# Patient Record
Sex: Female | Born: 1970 | Race: Black or African American | Hispanic: No | Marital: Single | Smoking: Never smoker
Health system: Southern US, Community
[De-identification: ages and names within clinical notes are randomized; demographics above are authoritative.]

## PROBLEM LIST (undated history)

## (undated) DIAGNOSIS — E0500 Thyrotoxicosis with diffuse goiter without thyrotoxic crisis or storm: Secondary | ICD-10-CM

## (undated) HISTORY — PX: MYOMECTOMY: SHX85

## (undated) HISTORY — PX: OTHER SURGICAL HISTORY: SHX169

---

## 1999-07-10 ENCOUNTER — Other Ambulatory Visit: Admission: RE | Admit: 1999-07-10 | Discharge: 1999-07-10 | Payer: Self-pay | Admitting: Obstetrics and Gynecology

## 2000-06-29 ENCOUNTER — Other Ambulatory Visit: Admission: RE | Admit: 2000-06-29 | Discharge: 2000-06-29 | Payer: Self-pay | Admitting: Obstetrics and Gynecology

## 2001-11-04 ENCOUNTER — Other Ambulatory Visit: Admission: RE | Admit: 2001-11-04 | Discharge: 2001-11-04 | Payer: Self-pay | Admitting: Obstetrics and Gynecology

## 2003-01-10 ENCOUNTER — Emergency Department (HOSPITAL_COMMUNITY): Admission: EM | Admit: 2003-01-10 | Discharge: 2003-01-10 | Payer: Self-pay | Admitting: Emergency Medicine

## 2003-01-10 ENCOUNTER — Encounter: Payer: Self-pay | Admitting: Emergency Medicine

## 2003-01-30 ENCOUNTER — Other Ambulatory Visit: Admission: RE | Admit: 2003-01-30 | Discharge: 2003-01-30 | Payer: Self-pay | Admitting: Obstetrics and Gynecology

## 2003-02-08 ENCOUNTER — Encounter: Payer: Self-pay | Admitting: Obstetrics and Gynecology

## 2003-02-08 ENCOUNTER — Ambulatory Visit (HOSPITAL_COMMUNITY): Admission: RE | Admit: 2003-02-08 | Discharge: 2003-02-08 | Payer: Self-pay | Admitting: Obstetrics and Gynecology

## 2003-02-09 ENCOUNTER — Encounter (INDEPENDENT_AMBULATORY_CARE_PROVIDER_SITE_OTHER): Payer: Self-pay

## 2003-02-09 ENCOUNTER — Ambulatory Visit (HOSPITAL_COMMUNITY): Admission: RE | Admit: 2003-02-09 | Discharge: 2003-02-09 | Payer: Self-pay | Admitting: Obstetrics and Gynecology

## 2004-04-03 ENCOUNTER — Other Ambulatory Visit: Admission: RE | Admit: 2004-04-03 | Discharge: 2004-04-03 | Payer: Self-pay | Admitting: Obstetrics and Gynecology

## 2005-06-09 ENCOUNTER — Other Ambulatory Visit: Admission: RE | Admit: 2005-06-09 | Discharge: 2005-06-09 | Payer: Self-pay | Admitting: Obstetrics and Gynecology

## 2007-08-11 ENCOUNTER — Inpatient Hospital Stay (HOSPITAL_COMMUNITY): Admission: RE | Admit: 2007-08-11 | Discharge: 2007-08-13 | Payer: Self-pay | Admitting: Obstetrics and Gynecology

## 2007-08-11 ENCOUNTER — Encounter (INDEPENDENT_AMBULATORY_CARE_PROVIDER_SITE_OTHER): Payer: Self-pay | Admitting: Obstetrics and Gynecology

## 2010-07-11 ENCOUNTER — Other Ambulatory Visit: Payer: Self-pay | Admitting: Obstetrics and Gynecology

## 2010-08-15 ENCOUNTER — Other Ambulatory Visit: Payer: Self-pay | Admitting: Obstetrics and Gynecology

## 2010-08-15 ENCOUNTER — Ambulatory Visit (HOSPITAL_COMMUNITY)
Admission: AD | Admit: 2010-08-15 | Discharge: 2010-08-15 | Disposition: A | Discharge status: Home / Self Care | Payer: BC Managed Care – PPO | Source: Ambulatory Visit | Attending: Obstetrics and Gynecology | Admitting: Obstetrics and Gynecology

## 2010-08-15 DIAGNOSIS — N920 Excessive and frequent menstruation with regular cycle: Secondary | ICD-10-CM | POA: Insufficient documentation

## 2010-08-15 DIAGNOSIS — D250 Submucous leiomyoma of uterus: Secondary | ICD-10-CM | POA: Insufficient documentation

## 2010-08-15 DIAGNOSIS — N840 Polyp of corpus uteri: Secondary | ICD-10-CM | POA: Insufficient documentation

## 2010-08-15 LAB — CBC
HCT: 35 % — ABNORMAL LOW (ref 36.0–46.0)
Hemoglobin: 11 g/dL — ABNORMAL LOW (ref 12.0–15.0)
MCH: 25.7 pg — ABNORMAL LOW (ref 26.0–34.0)
MCHC: 31.4 g/dL (ref 30.0–36.0)
MCV: 81.8 fL (ref 78.0–100.0)
Platelets: 293 10*3/uL (ref 150–400)
RBC: 4.28 MIL/uL (ref 3.87–5.11)
RDW: 14.7 % (ref 11.5–15.5)
WBC: 6.7 10*3/uL (ref 4.0–10.5)

## 2010-08-21 NOTE — Op Note (Signed)
  NAME:  Jacqueline Wade, Jacqueline Wade NO.:  [id number]  MEDICAL RECORD NO.:  [id number]           PATIENT TYPE:  O  LOCATION:  [facility]                          [facility]  PHYSICIAN:  Malva Limes, M.D.    DATE OF BIRTH:  [birth year]  DATE OF PROCEDURE:  08/15/2010 DATE OF DISCHARGE:                              OPERATIVE REPORT   PREOPERATIVE DIAGNOSES: 1. Menorrhagia. 2. Uterine fibroids.  POSTOPERATIVE DIAGNOSES: 1. Menorrhagia. 2. Uterine fibroids.  PROCEDURES: 1. Hysteroscopy. 2. Attempt at ThermaChoice balloon ablation. 3. Resection and ablation of uterine cavity with resectoscope  SURGEON:  Malva Limes, MD  ANESTHESIA:  General.  ANTIBIOTICS:  Ancef 1 g.  DRAINS:  Red rubber catheter to bladder.  COMPLICATIONS:  None.  SPECIMENS:  Endometrial curettings sent to Pathology.  ESTIMATED BLOOD LOSS:  30 mL.  FLUID DEFICIT:  540 mL.  PROCEDURE IN DETAILS:  The patient was taken to the operating room where general anesthetic was administered without difficulty.  She was then placed in a dorsal lithotomy position.  She was prepped and draped in the usual fashion for this procedure.  Her bladder was drained with a red rubber catheter.  A sterile speculum was placed in the vagina, 20 mL of 1% lidocaine was used for paracervical block.  The cervix was then serially dilated to a 29-French.  The uterus was sounded to 10 cm.  The hysteroscope was then advanced through the endocervical canal which appeared to be normal.  On entering the uterine cavity, several small polyps were seen.  There was a small submucous fibroid also seen on the right posterior body of the uterine cavity.  At this point, hysteroscope was removed and the D and C was performed.  The ThermaChoice balloon was then set up and placed into the uterine cavity, 70 mL of fluid were placed into the balloon and despite this we were not able to obtain an adequate pressure.  Therefore we had abort  the procedure because felt that the cavity was too large for this procedure.  A second device was used to verify and again similar problems were encountered.  The patient then had a resectoscope set up and the resectoscope with a wide blade used throughout the entire uterine cavity to resect and ablate the endometrial cavity.  Small fibroid was also reduced.  Once all areas retreated, the procedure was concluded.  The patient tolerated the procedure well.  Her fluid deficit was as mentioned is 540.  She had no problems on the operating room table.  The patient will be discharged to home.  She will be sent home with Vicodin to take p.r.n.  She will follow up in the office in 4 weeks.          ______________________________ Malva Limes, M.D.     MA/MEDQ  D:  08/15/2010  T:  08/16/2010  Job:  [id number]  Electronically Signed by Malva Limes M.D. on 08/21/2010 07:16:46 AM

## 2010-09-17 NOTE — Op Note (Signed)
NAME:  Jacqueline Wade, Jacqueline Wade NO.:  [id number]   MEDICAL RECORD NO.:  [id number]          PATIENT TYPE:  INP   LOCATION:  9306                          [facility]:  [facility]   PHYSICIAN:  Malva Limes, M.D.    DATE OF BIRTH:  [birth year]   DATE OF PROCEDURE:  08/11/2007  DATE OF DISCHARGE:                               OPERATIVE REPORT   PREOPERATIVE DIAGNOSIS:  1. Symptomatic uterine fibroids.  2. The patient desires future childbearing.   POSTOPERATIVE DIAGNOSIS:  1. Symptomatic uterine fibroids.  2. The patient desires future childbearing.   PROCEDURE:  Myomectomy.   SURGEON:  Malva Limes, M.D.   ASSISTANT:  Luvenia Redden, M.D.   ANESTHESIA:  General endotracheal.   ANTIBIOTICS:  Cefotan 1 gram.   ESTIMATED BLOOD LOSS:  350 mL.   DRAINS:  Foley at bedside drainage.   COMPLICATIONS:  None.   SPECIMENS:  Uterine fibroids sent to pathology.   PROCEDURE IN DETAIL:  The patient was taken to the operating room where  she was placed in dorsal supine position and general anesthetic was  administered without complications.  She was then prepped and draped in  the usual fashion for this procedure.  A Pfannenstiel incision was made  through the previous scar.  This was carried down to the fascia.  The  fascia was entered in the midline and extended laterally with the Mayo  scissors.  The rectus muscles were then dissected from the fascia with  the Bovie.  The rectus muscles were divided in the midline and taken out  superiorly and inferiorly.  The parietal peritoneum was entered sharply  and taken out superiorly and inferiorly.  At this point an examination  revealed no evidence of adhesions or endometriosis in the pelvis.  The  patient had several uterine myomas.  There was a large pedunculated one  in the left fundal region.  There was also a large one in the midbody on  the right, several smaller ones in the anterior and lateral margins.  The O'Connor-O'Sullivan  retractor was placed, the bowel packed away.  The uterus was then injected with 1% Marcaine with epinephrine.  An  incision was made on the posterior aspect vertically.  The large uterine  fibroid was removed.  The cavity was then closed with interrupted 0  Monocryl sutures and the serosal surface then closed with 0 Monocryl  suture in a running fashion.  Following this the patient had another  incision made in the posterior left margin where a 3-4 cm fibroid was  removed and closed in a similar fashion.  Following this the anterior  surface of the uterus was opened and multiple 2 cm fibroids were  removed.  This incision was near the lower uterine segment.  At no time  was the uterine cavity entered.  A large pedunculated fibroid on the  left fundus was then injected with 1% lidocaine with epinephrine.  The  fibroid was excised and then the large defect closed with interrupted 0  Monocryl sutures and 0 Monocryl suture in a running fashion.  This  concluded the  myomectomy.  At this point all incisions were covered with  Interceed and retractor and laps removed.  The parietal peritoneum and  rectus muscles were reapproximated in midline using 0 Monocryl suture.  The fascia was closed using 0 Monocryl suture in a running fashion.  Subcuticular tissue was made hemostatic with the Bovie and then  adhesions in the subcutaneous layer freed.  The skin was closed using 3-  0 Rapide in a subcuticular fashion.  Steri-Strips were then placed.  The  patient tolerated the procedure well.  She was taken to the recovery  room in stable condition.  Instrument and lap counts were correct x2.           ______________________________  Malva Limes, M.D.     MA/MEDQ  D:  08/11/2007  T:  08/11/2007  Job:  [id number]

## 2010-09-20 NOTE — Discharge Summary (Signed)
NAME:  Jacqueline, Wade NO.:  [id number]   MEDICAL RECORD NO.:  [id number]          PATIENT TYPE:  INP   LOCATION:  9306                          [facility]   PHYSICIAN:  Malva Limes, M.D.    DATE OF BIRTH:  02/21/1971   DATE OF ADMISSION:  08/11/2007  DATE OF DISCHARGE:  08/13/2007                               DISCHARGE SUMMARY   PRINCIPAL DISCHARGE DIAGNOSIS:  Symptomatic uterine fibroids.   PRINCIPAL PROCEDURES:  Myomectomy.   HISTORY OF PRESENT ILLNESS:  Ms. Jacqueline Wade is a 40 year old black female  G2, P1 who presented to the operating room on August 11, 2007 secondary to  symptomatic uterine fibroids.  The fibroids have been causing increased  pelvic pressure and menorrhagia.  The patient's fibroids were  approximately 14 weeks in size.  The patient was offered Lupron and  declined.  She does desire for future childbearing.  The patient  underwent a myomectomy.  A complete description of this can be found in  the operative note.  The patient's postoperative course was benign.  Postop hemoglobin was 10.5.  The patient was discharged to home on  postoperative day #2.  The patient's pathology was benign.  The patient  was discharged home with Percocet to take p.r.n.  She was instructed to  followup in the office in 4 weeks.           ______________________________  Malva Limes, M.D.     MA/MEDQ  D:  09/02/2007  T:  09/02/2007  Job:  [id number]

## 2010-09-20 NOTE — Op Note (Signed)
   NAME:  Jacqueline Wade, Jacqueline Wade NO.:  [id number]   MEDICAL RECORD NO.:  [id number]                   PATIENT TYPE:  AMB   LOCATION:  [facility]                                  [facility]   PHYSICIAN:  Malva Limes, M.D.                 DATE OF BIRTH:  [birth year]   DATE OF PROCEDURE:  02/09/2003  DATE OF DISCHARGE:                                 OPERATIVE REPORT   PREOPERATIVE DIAGNOSIS:  Missed abortion.   POSTOPERATIVE DIAGNOSIS:  Missed abortion.   PROCEDURE:  Dilation and curettage.   SURGEON:  Malva Limes, M.D.   ANESTHESIA:  MAC with paracervical block.   DRAINS:  None.   ANTIBIOTICS:  Ancef 1 g.   ESTIMATED BLOOD LOSS:  20 mL.   COMPLICATIONS:  None.   SPECIMENS:  Products of conception sent to pathology.   DESCRIPTION OF PROCEDURE:  The patient was taken to the operating room,  where she was placed in a dorsal supine position.  MAC anesthesia was  administered.  She was then placed in the dorsal lithotomy position.  She  was prepped with Hibiclens and draped with green towels.  A sterile speculum  was placed in the vagina, 20 mL of 1% lidocaine was used for paracervical  block.  The cervical os was then serially dilated to a 29 [place], and an 8  mm suction cannula was then placed to the endocervical canal and products of  conception withdrawn.  Sharp curettage was then performed, followed by  repeat suction.  The patient tolerated the procedure well, and she was taken  to the recovery room in stable condition.  Instrument and lap counts were  correct x1.  The patient will be discharged to home.  She will be instructed  to follow up in the office in four weeks.  She will be sent home with Keflex  500 mg q.i.d. for two days and Anaprox Double Strength p.r.n.  Her blood  type is Rh positive, and therefore no RhoGAM is indicated.                                               Malva Limes, M.D.    MA/MEDQ  D:  02/09/2003  T:  02/09/2003   Job:  [id number]

## 2011-01-28 LAB — CBC
Hemoglobin: 10.5 — ABNORMAL LOW
MCV: 84.2
Platelets: 272
RDW: 15.4
RDW: 16 — ABNORMAL HIGH
WBC: 7.4

## 2011-01-28 LAB — PREGNANCY, URINE: Preg Test, Ur: NEGATIVE

## 2012-07-26 ENCOUNTER — Other Ambulatory Visit: Payer: Self-pay | Admitting: Obstetrics and Gynecology

## 2012-08-25 ENCOUNTER — Emergency Department (HOSPITAL_COMMUNITY): Payer: BC Managed Care – PPO

## 2012-08-25 ENCOUNTER — Emergency Department (HOSPITAL_COMMUNITY)
Admission: EM | Admit: 2012-08-25 | Discharge: 2012-08-25 | Disposition: A | Discharge status: Home / Self Care | Payer: BC Managed Care – PPO | Attending: Emergency Medicine | Admitting: Emergency Medicine

## 2012-08-25 ENCOUNTER — Encounter (HOSPITAL_COMMUNITY): Payer: Self-pay | Admitting: *Deleted

## 2012-08-25 DIAGNOSIS — Z8639 Personal history of other endocrine, nutritional and metabolic disease: Secondary | ICD-10-CM | POA: Insufficient documentation

## 2012-08-25 DIAGNOSIS — N83209 Unspecified ovarian cyst, unspecified side: Secondary | ICD-10-CM | POA: Insufficient documentation

## 2012-08-25 DIAGNOSIS — R110 Nausea: Secondary | ICD-10-CM | POA: Insufficient documentation

## 2012-08-25 DIAGNOSIS — Z862 Personal history of diseases of the blood and blood-forming organs and certain disorders involving the immune mechanism: Secondary | ICD-10-CM | POA: Insufficient documentation

## 2012-08-25 DIAGNOSIS — Z79899 Other long term (current) drug therapy: Secondary | ICD-10-CM | POA: Insufficient documentation

## 2012-08-25 HISTORY — DX: Thyrotoxicosis with diffuse goiter without thyrotoxic crisis or storm: E05.00

## 2012-08-25 LAB — COMPREHENSIVE METABOLIC PANEL
ALT: 13 U/L (ref 0–35)
Alkaline Phosphatase: 60 U/L (ref 39–117)
BUN: 7 mg/dL (ref 6–23)
CO2: 24 mEq/L (ref 19–32)
Chloride: 101 mEq/L (ref 96–112)
GFR calc Af Amer: >90 mL/min (ref >90)
GFR calc non Af Amer: >90 mL/min (ref >90)
Glucose, Bld: 97 mg/dL (ref 70–99)
Potassium: 3.5 mEq/L (ref 3.5–5.1)
Sodium: 135 mEq/L (ref 135–145)
Total Bilirubin: 0.6 mg/dL (ref 0.3–1.2)

## 2012-08-25 LAB — URINALYSIS, ROUTINE W REFLEX MICROSCOPIC
Bilirubin Urine: NEGATIVE
Ketones, ur: NEGATIVE mg/dL
Leukocytes, UA: NEGATIVE
Nitrite: NEGATIVE
Protein, ur: NEGATIVE mg/dL

## 2012-08-25 LAB — CBC WITH DIFFERENTIAL/PLATELET
Eosinophils Absolute: 0 10*3/uL (ref 0.0–0.7)
Hemoglobin: 11.3 g/dL — ABNORMAL LOW (ref 12.0–15.0)
Lymphocytes Relative: 25 % (ref 12–46)
Lymphs Abs: 2.1 10*3/uL (ref 0.7–4.0)
MCH: 25.9 pg — ABNORMAL LOW (ref 26.0–34.0)
Monocytes Relative: 13 % — ABNORMAL HIGH (ref 3–12)
Neutro Abs: 5.1 10*3/uL (ref 1.7–7.7)
Neutrophils Relative %: 61 % (ref 43–77)
Platelets: 262 10*3/uL (ref 150–400)
RBC: 4.36 MIL/uL (ref 3.87–5.11)
WBC: 8.3 10*3/uL (ref 4.0–10.5)

## 2012-08-25 MED ORDER — KETOROLAC TROMETHAMINE 30 MG/ML IJ SOLN
30.0000 mg | Freq: Once | INTRAMUSCULAR | Status: AC
  Administered 2012-08-25: 30 mg via INTRAVENOUS
  Filled 2012-08-25: qty 1

## 2012-08-25 MED ORDER — IOHEXOL 300 MG/ML  SOLN
50.0000 mL | Freq: Once | INTRAMUSCULAR | Status: AC | PRN
  Administered 2012-08-25: 50 mL via ORAL

## 2012-08-25 MED ORDER — HYDROCODONE-ACETAMINOPHEN 5-325 MG PO TABS: 2.0000 | ORAL_TABLET | Freq: Once | ORAL | Status: DC

## 2012-08-25 MED ORDER — SODIUM CHLORIDE 0.9 % IV BOLUS (SEPSIS)
1000.0000 mL | Freq: Once | INTRAVENOUS | Status: AC
  Administered 2012-08-25: 1000 mL via INTRAVENOUS

## 2012-08-25 MED ORDER — HYDROCODONE-ACETAMINOPHEN 5-325 MG PO TABS: 1.0000 | ORAL_TABLET | Freq: Four times a day (QID) | ORAL | Status: DC | PRN

## 2012-08-25 MED ORDER — MORPHINE SULFATE 4 MG/ML IJ SOLN
4.0000 mg | Freq: Once | INTRAMUSCULAR | Status: AC
  Administered 2012-08-25: 4 mg via INTRAVENOUS
  Filled 2012-08-25: qty 1

## 2012-08-25 MED ORDER — IOHEXOL 300 MG/ML  SOLN
100.0000 mL | Freq: Once | INTRAMUSCULAR | Status: AC | PRN
  Administered 2012-08-25: 100 mL via INTRAVENOUS

## 2012-08-25 NOTE — ED Provider Notes (Signed)
History     CSN: [id number]  Arrival date & time 08/25/12  1237   None     Chief Complaint  Patient presents with  . Abdominal Pain  . Nausea    (Consider location/radiation/quality/duration/timing/severity/associated sxs/prior treatment) HPI 42 yo F with graves disease presenting with RLQ pain.  She states the pain started yesterday around 1330.  It has always been in the RLQ, but now it is worse with any sort of movement or sneezing, coughing.  Sharp, stabbing pain.  She was nauseous yesterday, but not today.  Chills last night.  No fevers, vomiting, diarrhea.  No blood in stool. Passing gas and stool today.  No dysuria, although urination seems to make the RLQ pain worse.  Denies possibility of pregnancy.  Past Medical History  Diagnosis Date  . Graves disease     Past Surgical History  Procedure Laterality Date  . Myomectomy    . Uterine ablation    . Cesarean section      History reviewed. No pertinent family history.  History  Substance Use Topics  . Smoking status: Never Smoker   . Smokeless tobacco: Never Used  . Alcohol Use: Yes     Comment: occ    OB History   Grav Para Term Preterm Abortions TAB SAB Ect Mult Living                  Review of Systems  Constitutional: Positive for chills. Negative for fever.  HENT: Negative.   Eyes: Negative.   Respiratory: Negative.   Cardiovascular: Negative.   Gastrointestinal: Positive for nausea and abdominal pain (RLQ). Negative for vomiting, diarrhea, constipation and blood in stool.  Genitourinary: Negative.   Musculoskeletal: Negative.   Skin: Negative.   Neurological: Negative.     Allergies  Review of patient's allergies indicates no known allergies.  Home Medications   Current Outpatient Rx  Name  Route  Sig  Dispense  Refill  . metoCLOPramide (REGLAN) 10 MG tablet   Oral   Take 10 mg by mouth 4 (four) times daily as needed (for migraines).         . Multiple Vitamin (MULTIVITAMIN WITH  MINERALS) TABS   Oral   Take 1 tablet by mouth daily.         . naproxen sodium (ANAPROX) 550 MG tablet   Oral   Take 550 mg by mouth 2 (two) times daily as needed (for menstruation pain).         . phentermine (ADIPEX-P) 37.5 MG tablet   Oral   Take 37.5 mg by mouth daily before breakfast.         . tranexamic acid (LYSTEDA) 650 MG TABS   Oral   Take 1,300 mg by mouth 3 (three) times daily as needed (for heavy bleeding during menstruation).           BP 117/74  Pulse 92  Temp(Src) 98 F (36.7 C) (Oral)  Resp 16  Wt 176 lb (79.833 kg)  SpO2 96%  LMP 08/08/2012  Physical Exam  Constitutional: She is oriented to person, place, and time. She appears well-developed and well-nourished. She appears distressed (only with bed movement).  HENT:  Head: Normocephalic and atraumatic.  Right Ear: External ear normal.  Left Ear: External ear normal.  Mouth/Throat: Oropharynx is clear and moist.  Eyes: Conjunctivae and EOM are normal. Right eye exhibits no discharge. Left eye exhibits no discharge.  Neck: Normal range of motion. Neck supple.  Cardiovascular: Normal  rate, regular rhythm, normal heart sounds and intact distal pulses.   No murmur heard. Pulmonary/Chest: Effort normal and breath sounds normal. No respiratory distress. She has no wheezes. She has no rales.  Abdominal: Soft. Bowel sounds are normal. She exhibits no distension. There is tenderness in the right lower quadrant. There is rebound and tenderness at McBurney's point. There is no rigidity, no guarding and negative Murphy's sign.  Musculoskeletal: She exhibits no edema and no tenderness.  Neurological: She is alert and oriented to person, place, and time. She exhibits normal muscle tone. Coordination normal.  Skin: Skin is warm and dry. No rash noted. She is not diaphoretic. No erythema.  Psychiatric: She has a normal mood and affect. Thought content normal.    ED Course  Procedures (including critical care  time)  Labs Reviewed  CBC WITH DIFFERENTIAL - Abnormal; Notable for the following:    Hemoglobin 11.3 (*)    HCT 34.3 (*)    MCH 25.9 (*)    RDW 17.1 (*)    Monocytes Relative 13 (*)    Monocytes Absolute 1.1 (*)    All other components within normal limits  URINALYSIS, ROUTINE W REFLEX MICROSCOPIC - Abnormal; Notable for the following:    Color, Urine AMBER (*)    Specific Gravity, Urine 1.031 (*)    All other components within normal limits  COMPREHENSIVE METABOLIC PANEL   Ct Abdomen Pelvis W Contrast  08/25/2012  *RADIOLOGY REPORT*  Clinical Data: Right lower quadrant abdominal pain.  Nausea. Myomectomy.  CT ABDOMEN AND PELVIS WITH CONTRAST  Technique:  Multidetector CT imaging of the abdomen and pelvis was performed following the standard protocol during bolus administration of intravenous contrast.  Contrast: OMNIPAQUE IOHEXOL 300 MG/ML  SOLN  Comparison: None available.  Findings: The lung bases are clear without focal nodule, mass, or airspace disease.  The heart size is normal.  No significant pleural or pericardial effusion is evident.  The liver and spleen are within normal limits.  A small hiatal hernia is noted.  The stomach, duodenum, and pancreas are within normal limits.  Common bile duct and gallbladder are normal.  The adrenal glands are normal bilaterally.  The kidneys and ureters are unremarkable.  Rectosigmoid colon is within normal limits.  The remainder of the colon is within normal limits.  The appendix is visualized and within normal limits.  The uterus is enlarged with multiple fibroids.  It measures 8.1 x 8.2 x 13.4 cm.  No dominant masses evident.  A low-density lesion of the right ovary measures 3.2 x 3.7 cm, compatible with a simple cyst.  No other significant adnexal lesions are evident.  There is no significant free fluid or adenopathy.  The bone windows are unremarkable.  IMPRESSION:  1.  Normal appearance of the appendix. 2.  Peripherally enhancing 3.2 x 3.7  cm cyst in the right cystic lesion in the right ovary could be etiology of the patient's pain. 3.  Enlarged fibroid uterus. 4.  No other acute or focal abnormality to explain the patient's symptoms.   Original Report Authenticated By: Marin Roberts, M.D.      No diagnosis found.    MDM  42 yo F with RLQ pain, found to have pain at McBurney's point and peritoneal signs on exam.  Will check cbc, cmp, ua and ct abd/pelvis for appendicitis.  SBO unlikely with report of gas and flatus today.  Ovarian cyst rupture or torsion is possible, but less likely.  Will given some  IVFs, patient declined pain medicine at this time.   3:00PM: labs are unremarkable.  CT shows right ovarian simple cyst.  Discussed diagnosis with patient.  Will discharge pain medication and follow up with her OB/GYN, Dr. Dareen Piano.  Reviewed warnings for ovarian torsion. She was agreeable with this plan.        Phebe Colla, MD 08/25/12 [phone]

## 2012-08-25 NOTE — ED Provider Notes (Signed)
I saw and evaluated the patient, reviewed the resident's note and I agree with the findings and plan.  Right sided ovarian cyst, normal appendix. Dc home in good condition  Lyanne Co, MD 08/25/12 1544

## 2012-08-25 NOTE — ED Notes (Signed)
[id number]> Expected date:<BR> Expected time:<BR> Means of arrival:<BR> Comments:<BR> Triage-r/o appey

## 2012-08-25 NOTE — Progress Notes (Signed)
Pt confirms pcp is aaron morrow  EPIC updated

## 2012-08-25 NOTE — ED Notes (Signed)
Pt sent from PCP to rule out appendicitis with reports of RLQ pain and slight nausea that started yesterday around 1330. Pt also reports low grade fever.

## 2013-06-03 ENCOUNTER — Other Ambulatory Visit: Payer: Self-pay | Admitting: Obstetrics and Gynecology

## 2013-06-24 ENCOUNTER — Other Ambulatory Visit (HOSPITAL_COMMUNITY): Payer: Self-pay | Admitting: Obstetrics and Gynecology

## 2013-06-24 DIAGNOSIS — Z1231 Encounter for screening mammogram for malignant neoplasm of breast: Secondary | ICD-10-CM

## 2013-06-29 ENCOUNTER — Ambulatory Visit (HOSPITAL_COMMUNITY)
Admission: RE | Admit: 2013-06-29 | Discharge: 2013-06-29 | Disposition: A | Discharge status: Home / Self Care | Payer: BC Managed Care – PPO | Source: Ambulatory Visit | Attending: Obstetrics and Gynecology | Admitting: Obstetrics and Gynecology

## 2013-06-29 DIAGNOSIS — Z1231 Encounter for screening mammogram for malignant neoplasm of breast: Secondary | ICD-10-CM | POA: Insufficient documentation

## 2013-07-06 ENCOUNTER — Encounter (HOSPITAL_COMMUNITY): Payer: Self-pay | Admitting: Pharmacist

## 2013-07-07 ENCOUNTER — Encounter (HOSPITAL_COMMUNITY)
Admission: RE | Admit: 2013-07-07 | Discharge: 2013-07-07 | Disposition: A | Discharge status: Home / Self Care | Payer: BC Managed Care – PPO | Source: Ambulatory Visit | Attending: Obstetrics and Gynecology | Admitting: Obstetrics and Gynecology

## 2013-07-07 ENCOUNTER — Encounter (HOSPITAL_COMMUNITY): Payer: Self-pay

## 2013-07-07 DIAGNOSIS — Z01812 Encounter for preprocedural laboratory examination: Secondary | ICD-10-CM | POA: Insufficient documentation

## 2013-07-07 LAB — CBC
HEMATOCRIT: 38.5 % (ref 36.0–46.0)
Hemoglobin: 13.3 g/dL (ref 12.0–15.0)
MCH: 30.6 pg (ref 26.0–34.0)
MCHC: 34.5 g/dL (ref 30.0–36.0)
MCV: 88.5 fL (ref 78.0–100.0)
PLATELETS: 214 10*3/uL (ref 150–400)
RBC: 4.35 MIL/uL (ref 3.87–5.11)
RDW: 14.7 % (ref 11.5–15.5)
WBC: 5.5 10*3/uL (ref 4.0–10.5)

## 2013-07-07 NOTE — Pre-Procedure Instructions (Signed)
Pt instructed to stop Phentermine immediately and take no more dosages prior to surgery.

## 2013-07-07 NOTE — Patient Instructions (Signed)
20 Jacqueline Wade  07/07/2013   Your procedure is scheduled on:  07/13/13  Enter through the Main Entrance of [facility] [place] up the phone at the desk and dial [phone].   Call this number if you have problems the morning of surgery: [phone]   Remember:   Do not eat food:After Midnight.  Do not drink clear liquids: After Midnight.  Take these medicines the morning of surgery with A SIP OF WATER: NA   Do not wear jewelry, make-up or nail polish.  Do not wear lotions, powders, or perfumes. You may wear deodorant.  Do not shave 24 hours prior to surgery.  Do not bring valuables to the hospital.  [facility] is not   responsible for any belongings or valuables brought to the hospital.  Contacts, dentures or bridgework may not be worn into surgery.  Leave suitcase in the car. After surgery it may be brought to your room.  For patients admitted to the hospital, checkout time is 11:00 AM the day of              discharge.   Patients discharged the day of surgery will not be allowed to drive             home.  Name and phone number of your driver: NA  Special Instructions:      Please read over the following fact sheets that you were given:   Surgical Site Infection Prevention

## 2013-07-12 ENCOUNTER — Encounter (HOSPITAL_COMMUNITY): Payer: Self-pay | Admitting: Anesthesiology

## 2013-07-12 MED ORDER — DEXTROSE 5 % IV SOLN
2.0000 g | INTRAVENOUS | Status: AC
  Administered 2013-07-13: 2 g via INTRAVENOUS
  Filled 2013-07-12: qty 2

## 2013-07-12 NOTE — Anesthesia Preprocedure Evaluation (Addendum)
Anesthesia Evaluation  Patient identified by MRN, date of birth, ID band Patient awake    Reviewed: Allergy & Precautions, H&P , NPO status , Patient's Chart, lab work & pertinent test results  Airway Mallampati: II TM Distance: >3 FB Neck ROM: Full    Dental no notable dental hx. (+) Teeth Intact   Pulmonary neg pulmonary ROS,  breath sounds clear to auscultation  Pulmonary exam normal       Cardiovascular negative cardio ROS  Rhythm:Regular Rate:Normal     Neuro/Psych negative neurological ROS  negative psych ROS   GI/Hepatic Neg liver ROS, GERD-  ,  Endo/Other  Hyperthyroidism Hx/o Grave's Disease Obesity  Renal/GU negative Renal ROS  negative genitourinary   Musculoskeletal   Abdominal   Peds  Hematology   Anesthesia Other Findings   Reproductive/Obstetrics Uterine fibroids menometrorrhagia                          Anesthesia Physical Anesthesia Plan  ASA: II  Anesthesia Plan: General   Post-op Pain Management:    Induction: Intravenous  Airway Management Planned: Oral ETT  Additional Equipment:   Intra-op Plan:   Post-operative Plan: Extubation in OR  Informed Consent: I have reviewed the patients History and Physical, chart, labs and discussed the procedure including the risks, benefits and alternatives for the proposed anesthesia with the patient or authorized representative who has indicated his/her understanding and acceptance.   Dental advisory given  Plan Discussed with: Anesthesiologist, CRNA and Surgeon  Anesthesia Plan Comments:         Anesthesia Quick Evaluation

## 2013-07-13 ENCOUNTER — Inpatient Hospital Stay (HOSPITAL_COMMUNITY): Payer: BC Managed Care – PPO | Admitting: Anesthesiology

## 2013-07-13 ENCOUNTER — Observation Stay (HOSPITAL_COMMUNITY)
Admission: RE | Admit: 2013-07-13 | Discharge: 2013-07-15 | Disposition: A | Discharge status: Home / Self Care | Payer: BC Managed Care – PPO | Source: Ambulatory Visit | Attending: Obstetrics and Gynecology | Admitting: Obstetrics and Gynecology

## 2013-07-13 ENCOUNTER — Encounter (HOSPITAL_COMMUNITY): Payer: BC Managed Care – PPO | Admitting: Anesthesiology

## 2013-07-13 ENCOUNTER — Encounter (HOSPITAL_COMMUNITY)
Admission: RE | Disposition: A | Discharge status: Home / Self Care | Payer: Self-pay | Source: Ambulatory Visit | Attending: Obstetrics and Gynecology

## 2013-07-13 ENCOUNTER — Encounter (HOSPITAL_COMMUNITY): Payer: Self-pay | Admitting: Anesthesiology

## 2013-07-13 DIAGNOSIS — D252 Subserosal leiomyoma of uterus: Secondary | ICD-10-CM | POA: Insufficient documentation

## 2013-07-13 DIAGNOSIS — N946 Dysmenorrhea, unspecified: Secondary | ICD-10-CM | POA: Insufficient documentation

## 2013-07-13 DIAGNOSIS — N838 Other noninflammatory disorders of ovary, fallopian tube and broad ligament: Secondary | ICD-10-CM | POA: Insufficient documentation

## 2013-07-13 DIAGNOSIS — K219 Gastro-esophageal reflux disease without esophagitis: Secondary | ICD-10-CM | POA: Insufficient documentation

## 2013-07-13 DIAGNOSIS — N920 Excessive and frequent menstruation with regular cycle: Principal | ICD-10-CM | POA: Insufficient documentation

## 2013-07-13 DIAGNOSIS — E0500 Thyrotoxicosis with diffuse goiter without thyrotoxic crisis or storm: Secondary | ICD-10-CM | POA: Insufficient documentation

## 2013-07-13 DIAGNOSIS — D219 Benign neoplasm of connective and other soft tissue, unspecified: Secondary | ICD-10-CM

## 2013-07-13 DIAGNOSIS — E669 Obesity, unspecified: Secondary | ICD-10-CM | POA: Insufficient documentation

## 2013-07-13 HISTORY — PX: ABDOMINAL HYSTERECTOMY: SHX81

## 2013-07-13 LAB — PREGNANCY, URINE: PREG TEST UR: NEGATIVE

## 2013-07-13 SURGERY — HYSTERECTOMY, ABDOMINAL
Anesthesia: General | Site: Abdomen

## 2013-07-13 MED ORDER — ROCURONIUM BROMIDE 100 MG/10ML IV SOLN
INTRAVENOUS | Status: DC | PRN
  Administered 2013-07-13 (×2): 10 mg via INTRAVENOUS
  Administered 2013-07-13: 40 mg via INTRAVENOUS

## 2013-07-13 MED ORDER — NALOXONE HCL 0.4 MG/ML IJ SOLN: 0.4000 mg | INTRAMUSCULAR | Status: DC | PRN

## 2013-07-13 MED ORDER — MIDAZOLAM HCL 2 MG/2ML IJ SOLN
INTRAMUSCULAR | Status: AC
  Filled 2013-07-13: qty 2

## 2013-07-13 MED ORDER — HYDROMORPHONE HCL PF 1 MG/ML IJ SOLN
0.2000 mg | INTRAMUSCULAR | Status: DC | PRN
  Administered 2013-07-13 (×2): 0.6 mg via INTRAVENOUS
  Filled 2013-07-13 (×2): qty 1

## 2013-07-13 MED ORDER — HYDROMORPHONE HCL 2 MG PO TABS: 2.0000 mg | ORAL_TABLET | ORAL | Status: DC | PRN

## 2013-07-13 MED ORDER — LIDOCAINE HCL (CARDIAC) 20 MG/ML IV SOLN
INTRAVENOUS | Status: DC | PRN
  Administered 2013-07-13: 70 mg via INTRAVENOUS
  Administered 2013-07-13: 30 mg via INTRAVENOUS

## 2013-07-13 MED ORDER — FENTANYL CITRATE 0.05 MG/ML IJ SOLN
INTRAMUSCULAR | Status: AC
  Filled 2013-07-13: qty 5

## 2013-07-13 MED ORDER — PROPOFOL 10 MG/ML IV BOLUS
INTRAVENOUS | Status: DC | PRN
  Administered 2013-07-13: 20 mg via INTRAVENOUS
  Administered 2013-07-13: 180 mg via INTRAVENOUS

## 2013-07-13 MED ORDER — DOCUSATE SODIUM 100 MG PO CAPS
100.0000 mg | ORAL_CAPSULE | Freq: Two times a day (BID) | ORAL | Status: DC
  Administered 2013-07-13 – 2013-07-15 (×4): 100 mg via ORAL
  Filled 2013-07-13 (×4): qty 1

## 2013-07-13 MED ORDER — ROCURONIUM BROMIDE 100 MG/10ML IV SOLN
INTRAVENOUS | Status: AC
  Filled 2013-07-13: qty 1

## 2013-07-13 MED ORDER — DIPHENHYDRAMINE HCL 12.5 MG/5ML PO ELIX: 12.5000 mg | ORAL_SOLUTION | Freq: Four times a day (QID) | ORAL | Status: DC | PRN

## 2013-07-13 MED ORDER — ONDANSETRON HCL 4 MG/2ML IJ SOLN
INTRAMUSCULAR | Status: DC | PRN
  Administered 2013-07-13: 4 mg via INTRAVENOUS

## 2013-07-13 MED ORDER — HYDROMORPHONE HCL PF 1 MG/ML IJ SOLN
INTRAMUSCULAR | Status: AC
  Filled 2013-07-13: qty 1

## 2013-07-13 MED ORDER — FENTANYL CITRATE 0.05 MG/ML IJ SOLN
INTRAMUSCULAR | Status: AC
  Filled 2013-07-13: qty 2

## 2013-07-13 MED ORDER — HYDROMORPHONE HCL PF 1 MG/ML IJ SOLN: 0.2000 mg | INTRAMUSCULAR | Status: DC | PRN

## 2013-07-13 MED ORDER — ONDANSETRON HCL 4 MG/2ML IJ SOLN: 4.0000 mg | Freq: Four times a day (QID) | INTRAMUSCULAR | Status: DC | PRN

## 2013-07-13 MED ORDER — SIMETHICONE 80 MG PO CHEW: 80.0000 mg | CHEWABLE_TABLET | Freq: Four times a day (QID) | ORAL | Status: DC | PRN

## 2013-07-13 MED ORDER — NEOSTIGMINE METHYLSULFATE 1 MG/ML IJ SOLN
INTRAMUSCULAR | Status: AC
  Filled 2013-07-13: qty 1

## 2013-07-13 MED ORDER — SODIUM CHLORIDE 0.9 % IJ SOLN: 9.0000 mL | INTRAMUSCULAR | Status: DC | PRN

## 2013-07-13 MED ORDER — DEXAMETHASONE SODIUM PHOSPHATE 10 MG/ML IJ SOLN
INTRAMUSCULAR | Status: AC
  Filled 2013-07-13: qty 1

## 2013-07-13 MED ORDER — PROMETHAZINE HCL 25 MG/ML IJ SOLN: 6.2500 mg | INTRAMUSCULAR | Status: DC | PRN

## 2013-07-13 MED ORDER — DEXTROSE IN LACTATED RINGERS 5 % IV SOLN
INTRAVENOUS | Status: DC
  Administered 2013-07-13 – 2013-07-14 (×2): via INTRAVENOUS

## 2013-07-13 MED ORDER — MIDAZOLAM HCL 2 MG/2ML IJ SOLN
INTRAMUSCULAR | Status: DC | PRN
  Administered 2013-07-13: 2 mg via INTRAVENOUS

## 2013-07-13 MED ORDER — ONDANSETRON HCL 4 MG PO TABS: 4.0000 mg | ORAL_TABLET | Freq: Four times a day (QID) | ORAL | Status: DC | PRN

## 2013-07-13 MED ORDER — DIPHENHYDRAMINE HCL 50 MG/ML IJ SOLN: 12.5000 mg | Freq: Four times a day (QID) | INTRAMUSCULAR | Status: DC | PRN

## 2013-07-13 MED ORDER — DEXAMETHASONE SODIUM PHOSPHATE 10 MG/ML IJ SOLN
INTRAMUSCULAR | Status: DC | PRN
  Administered 2013-07-13: 10 mg via INTRAVENOUS

## 2013-07-13 MED ORDER — HYDROMORPHONE HCL PF 1 MG/ML IJ SOLN
INTRAMUSCULAR | Status: AC
  Administered 2013-07-13: 0.5 mg via INTRAVENOUS
  Filled 2013-07-13: qty 1

## 2013-07-13 MED ORDER — DEXTROSE IN LACTATED RINGERS 5 % IV SOLN
INTRAVENOUS | Status: DC
  Administered 2013-07-13: 15:00:00 via INTRAVENOUS

## 2013-07-13 MED ORDER — LACTATED RINGERS IV SOLN
INTRAVENOUS | Status: DC
  Administered 2013-07-13 (×2): via INTRAVENOUS

## 2013-07-13 MED ORDER — LIDOCAINE HCL (CARDIAC) 20 MG/ML IV SOLN
INTRAVENOUS | Status: AC
  Filled 2013-07-13: qty 5

## 2013-07-13 MED ORDER — NEOSTIGMINE METHYLSULFATE 1 MG/ML IJ SOLN
INTRAMUSCULAR | Status: DC | PRN
  Administered 2013-07-13: 3 mg via INTRAVENOUS

## 2013-07-13 MED ORDER — HYDROMORPHONE 0.3 MG/ML IV SOLN
INTRAVENOUS | Status: DC
  Administered 2013-07-13: 0.3 mg via INTRAVENOUS
  Administered 2013-07-13: 6 mg via INTRAVENOUS
  Administered 2013-07-14: 4.2 mg via INTRAVENOUS
  Administered 2013-07-14: 6 mg via INTRAVENOUS
  Filled 2013-07-13: qty 25

## 2013-07-13 MED ORDER — ACETAMINOPHEN 10 MG/ML IV SOLN
1000.0000 mg | Freq: Four times a day (QID) | INTRAVENOUS | Status: DC
  Administered 2013-07-13: 1000 mg via INTRAVENOUS
  Filled 2013-07-13: qty 100

## 2013-07-13 MED ORDER — DIPHENHYDRAMINE HCL 50 MG/ML IJ SOLN
12.5000 mg | Freq: Four times a day (QID) | INTRAMUSCULAR | Status: DC | PRN
  Administered 2013-07-13: 12.5 mg via INTRAVENOUS
  Filled 2013-07-13: qty 1

## 2013-07-13 MED ORDER — PROPOFOL 10 MG/ML IV EMUL
INTRAVENOUS | Status: AC
  Filled 2013-07-13: qty 20

## 2013-07-13 MED ORDER — FENTANYL CITRATE 0.05 MG/ML IJ SOLN
INTRAMUSCULAR | Status: DC | PRN
  Administered 2013-07-13: 50 ug via INTRAVENOUS
  Administered 2013-07-13: 100 ug via INTRAVENOUS
  Administered 2013-07-13 (×2): 50 ug via INTRAVENOUS
  Administered 2013-07-13: 100 ug via INTRAVENOUS

## 2013-07-13 MED ORDER — ONDANSETRON HCL 4 MG/2ML IJ SOLN
INTRAMUSCULAR | Status: AC
  Filled 2013-07-13: qty 2

## 2013-07-13 MED ORDER — KETOROLAC TROMETHAMINE 30 MG/ML IJ SOLN
INTRAMUSCULAR | Status: AC
  Filled 2013-07-13: qty 1

## 2013-07-13 MED ORDER — HYDROMORPHONE HCL PF 1 MG/ML IJ SOLN
0.2500 mg | INTRAMUSCULAR | Status: DC | PRN
  Administered 2013-07-13 (×5): 0.5 mg via INTRAVENOUS

## 2013-07-13 MED ORDER — HYDROMORPHONE 0.3 MG/ML IV SOLN
INTRAVENOUS | Status: DC
  Administered 2013-07-13: 15 mL via INTRAVENOUS
  Administered 2013-07-13: 15:00:00 via INTRAVENOUS
  Filled 2013-07-13: qty 25

## 2013-07-13 MED ORDER — HYDROMORPHONE HCL PF 1 MG/ML IJ SOLN
INTRAMUSCULAR | Status: DC | PRN
  Administered 2013-07-13 (×2): 0.5 mg via INTRAVENOUS

## 2013-07-13 MED ORDER — OXYCODONE-ACETAMINOPHEN 5-325 MG PO TABS
1.0000 | ORAL_TABLET | ORAL | Status: DC | PRN
  Administered 2013-07-14 – 2013-07-15 (×7): 2 via ORAL
  Filled 2013-07-13 (×5): qty 2
  Filled 2013-07-13: qty 1
  Filled 2013-07-13 (×2): qty 2

## 2013-07-13 MED ORDER — KETOROLAC TROMETHAMINE 30 MG/ML IJ SOLN
15.0000 mg | Freq: Once | INTRAMUSCULAR | Status: AC | PRN
  Administered 2013-07-13: 30 mg via INTRAVENOUS

## 2013-07-13 MED ORDER — KETOROLAC TROMETHAMINE 30 MG/ML IJ SOLN
INTRAMUSCULAR | Status: DC | PRN
  Administered 2013-07-13: 30 mg via INTRAVENOUS

## 2013-07-13 MED ORDER — GLYCOPYRROLATE 0.2 MG/ML IJ SOLN
INTRAMUSCULAR | Status: DC | PRN
  Administered 2013-07-13: 0.6 mg via INTRAVENOUS

## 2013-07-13 MED ORDER — GLYCOPYRROLATE 0.2 MG/ML IJ SOLN
INTRAMUSCULAR | Status: AC
  Filled 2013-07-13: qty 3

## 2013-07-13 SURGICAL SUPPLY — 40 items
ADH SKN CLS APL DERMABOND .7 (GAUZE/BANDAGES/DRESSINGS) ×1
CANISTER SUCT 3000ML (MISCELLANEOUS) ×3 IMPLANT
CELLS DAT CNTRL 66122 CELL SVR (MISCELLANEOUS) IMPLANT
CLOTH BEACON ORANGE TIMEOUT ST (SAFETY) ×3 IMPLANT
CONT PATH 16OZ SNAP LID 3702 (MISCELLANEOUS) ×3 IMPLANT
DECANTER SPIKE VIAL GLASS SM (MISCELLANEOUS) IMPLANT
DERMABOND ADVANCED (GAUZE/BANDAGES/DRESSINGS) ×2
DERMABOND ADVANCED .7 DNX12 (GAUZE/BANDAGES/DRESSINGS) IMPLANT
DRAPE WARM FLUID 44X44 (DRAPE) IMPLANT
DRSG TELFA 3X8 NADH (GAUZE/BANDAGES/DRESSINGS) ×3 IMPLANT
GLOVE ECLIPSE 7.0 STRL STRAW (GLOVE) ×10 IMPLANT
GOWN STRL REUS W/TWL LRG LVL3 (GOWN DISPOSABLE) ×9 IMPLANT
NDL HYPO 25X1 1.5 SAFETY (NEEDLE) IMPLANT
NEEDLE HYPO 25X1 1.5 SAFETY (NEEDLE) IMPLANT
NS IRRIG 1000ML POUR BTL (IV SOLUTION) ×3 IMPLANT
PACK ABDOMINAL GYN (CUSTOM PROCEDURE TRAY) ×3 IMPLANT
PAD ABD 7.5X8 STRL (GAUZE/BANDAGES/DRESSINGS) ×2 IMPLANT
PAD DRESSING TELFA 3X8 NADH (GAUZE/BANDAGES/DRESSINGS) IMPLANT
PAD OB MATERNITY 4.3X12.25 (PERSONAL CARE ITEMS) ×3 IMPLANT
PROTECTOR NERVE ULNAR (MISCELLANEOUS) ×3 IMPLANT
RETRACTOR WND ALEXIS 18 MED (MISCELLANEOUS) IMPLANT
RETRACTOR WND ALEXIS 25 LRG (MISCELLANEOUS) IMPLANT
RTRCTR WOUND ALEXIS 18CM MED (MISCELLANEOUS)
RTRCTR WOUND ALEXIS 25CM LRG (MISCELLANEOUS)
SPONGE GAUZE 4X4 12PLY STER LF (GAUZE/BANDAGES/DRESSINGS) ×2 IMPLANT
SPONGE LAP 18X18 X RAY DECT (DISPOSABLE) ×8 IMPLANT
STAPLER VISISTAT 35W (STAPLE) ×3 IMPLANT
SUT MNCRL 0 MO-4 VIOLET 18 CR (SUTURE) ×3 IMPLANT
SUT MNCRL 0 VIOLET 6X18 (SUTURE) ×1 IMPLANT
SUT MNCRL AB 0 CT1 27 (SUTURE) ×6 IMPLANT
SUT MON AB 2-0 CT1 27 (SUTURE) IMPLANT
SUT MONOCRYL 0 6X18 (SUTURE) ×2
SUT MONOCRYL 0 MO 4 18  CR/8 (SUTURE) ×6
SUT SILK 3 0 SH 30 (SUTURE) IMPLANT
SUT VIC AB 4-0 KS 27 (SUTURE) ×2 IMPLANT
SYR CONTROL 10ML LL (SYRINGE) IMPLANT
TAPE CLOTH SURG 4X10 WHT LF (GAUZE/BANDAGES/DRESSINGS) ×2 IMPLANT
TOWEL OR 17X24 6PK STRL BLUE (TOWEL DISPOSABLE) ×8 IMPLANT
TRAY FOLEY CATH 14FR (SET/KITS/TRAYS/PACK) ×3 IMPLANT
WATER STERILE IRR 1000ML POUR (IV SOLUTION) ×3 IMPLANT

## 2013-07-13 NOTE — Addendum Note (Signed)
Addendum created 07/13/13 1538 by Hubbard Robinson, CRNA   Modules edited: Notes Section   Notes Section:  File: [id number]

## 2013-07-13 NOTE — H&P (Signed)
Jacqueline Wade is an 43 y.o. black female G2P1 who presents to the OR for a TAH bilateral salpingectomy for menorrhagia and dysmenorrhea. She states symptoms have worsened over the last 2 yrs. She has been txd with a balloon ablation and ocps. She declined placement of an  IUD. She also has had a C/S. Her menses are regular.   Chief Complaint: HPI:  Past Medical History  Diagnosis Date  . Graves disease     normal labs at last MD visit, no meds now    Past Surgical History  Procedure Laterality Date  . Myomectomy    . Uterine ablation    . Cesarean section      History reviewed. No pertinent family history. Social History:  reports that she has never smoked. She has never used smokeless tobacco. She reports that she drinks alcohol. She reports that she does not use illicit drugs.  Allergies: No Known Allergies  Medications Prior to Admission  Medication Sig Dispense Refill  . clonazePAM (KLONOPIN) 0.5 MG tablet Take 0.25 mg by mouth 2 (two) times daily as needed for anxiety.      . human chorionic gonadotropin (PREGNYL/NOVAREL) 10000 UNITS injection Inject into the muscle 2 (two) times a week.       . naproxen sodium (ANAPROX) 550 MG tablet Take 550 mg by mouth 2 (two) times daily as needed (for menstruation pain).      . phentermine (ADIPEX-P) 37.5 MG tablet Take 37.5 mg by mouth daily before breakfast.      . metoCLOPramide (REGLAN) 10 MG tablet Take 10 mg by mouth 4 (four) times daily as needed (for migraines).      . miconazole (MONISTAT-3) KIT Place 1 each vaginally at bedtime.      . Multiple Vitamin (MULTIVITAMIN WITH MINERALS) TABS Take 1 tablet by mouth daily.      . tranexamic acid (LYSTEDA) 650 MG TABS Take 1,300 mg by mouth 3 (three) times daily as needed (for heavy bleeding during menstruation).           Blood pressure 126/91, pulse 85, temperature 98.1 F (36.7 C), temperature source Oral, resp. rate 20, SpO2 100.00%. BP 126/91  Pulse 85  Temp(Src) 98.1 F  (36.7 C) (Oral)  Resp 20  SpO2 100% General appearance: alert and cooperative Lungs: clear to auscultation bilaterally Abdomen: soft, non-tender; bowel sounds normal; no masses,  no organomegaly Pelvic: deferred to the or   Lab Results  Component Value Date   WBC 5.5 07/07/2013   HGB 13.3 07/07/2013   HCT 38.5 07/07/2013   MCV 88.5 07/07/2013   PLT 214 07/07/2013   Lab Results  Component Value Date   PREGTESTUR NEGATIVE 07/13/2013       There are no active problems to display for this patient.  IMP/ fibroids, menorrhagia, dysmenorrhea PLAN/ Proceed with TAH , bilateral salpingectomy    ANDERSON,MARK E 07/13/2013, 7:58 AM

## 2013-07-13 NOTE — Anesthesia Postprocedure Evaluation (Signed)
Anesthesia Post Note  Patient: Jacqueline Wade  Procedure(s) Performed: Procedure(s) (LRB): HYSTERECTOMY ABDOMINAL (N/A)  Anesthesia type: General  Patient location: [facility]  Post pain: Pain level controlled  Post assessment: Post-op Vital signs reviewed  Last Vitals:  Filed Vitals:   07/13/13 1315  BP: 133/84  Pulse: 99  Temp: 36.8 C  Resp: 16    Post vital signs: Reviewed  Level of consciousness: sedated  Complications: No apparent anesthesia complications Anesthesia Post-op Note  Patient: Jacqueline Wade  Procedure(s) Performed: Procedure(s): HYSTERECTOMY ABDOMINAL (N/A)  Patient Location: PACU and [facility] Unit  Anesthesia Type:General  Level of Consciousness: awake, oriented and patient cooperative  Airway and Oxygen Therapy: Patient connected to nasal cannula oxygen  Post-op Pain: moderate  Post-op Assessment: Post-op Vital signs reviewed, Respiratory Function Stable, No signs of Nausea or vomiting and Pain level controlled  Post-op Vital Signs: Reviewed and stable  Complications: No apparent anesthesia complications

## 2013-07-13 NOTE — Transfer of Care (Signed)
Immediate Anesthesia Transfer of Care Note  Patient: Jacqueline Wade  Procedure(s) Performed: Procedure(s): HYSTERECTOMY ABDOMINAL (N/A)  Patient Location: PACU  Anesthesia Type:General  Level of Consciousness: awake, sedated and patient cooperative  Airway & Oxygen Therapy: Patient Spontanous Breathing and Patient connected to nasal cannula oxygen  Post-op Assessment: Report given to PACU RN and Post -op Vital signs reviewed and stable  Post vital signs: Reviewed and stable  Complications: No apparent anesthesia complications

## 2013-07-13 NOTE — Anesthesia Postprocedure Evaluation (Signed)
  Anesthesia Post Note  Patient: Jacqueline Wade  Procedure(s) Performed: Procedure(s) (LRB): HYSTERECTOMY ABDOMINAL (N/A)  Anesthesia type: GA  Patient location: PACU  Post pain: Pain level controlled  Post assessment: Post-op Vital signs reviewed  Last Vitals:  Filed Vitals:   07/13/13 1045  BP: 137/71  Pulse: 73  Temp:   Resp: 14    Post vital signs: Reviewed  Level of consciousness: sedated  Complications: No apparent anesthesia complications

## 2013-07-13 NOTE — Progress Notes (Signed)
I received a referral from pt's RN due to family anxiety about pain.  Jacqueline Wade was understanding that there would be pain involved and she did mention that the heating pad was helping.  Her parents seemed quite upset about seeing their daughter hurting.  I offered support to the parents and was well received by pt's mother.  Her father was less receptive and I wonder what his experience has been with hospitals/pain in the past.  Kathrynn Humble Pager, [phone] 3:31 PM   07/13/13 1500  Clinical Encounter Type  Visited With Patient and family together  Visit Type Spiritual support  Referral From Nurse (RN, Vonzella Nipple)

## 2013-07-14 ENCOUNTER — Encounter (HOSPITAL_COMMUNITY): Payer: Self-pay | Admitting: Obstetrics and Gynecology

## 2013-07-14 LAB — CBC
HCT: 31.7 % — ABNORMAL LOW (ref 36.0–46.0)
Hemoglobin: 11 g/dL — ABNORMAL LOW (ref 12.0–15.0)
MCH: 30.8 pg (ref 26.0–34.0)
MCHC: 34.7 g/dL (ref 30.0–36.0)
MCV: 88.8 fL (ref 78.0–100.0)
PLATELETS: 217 10*3/uL (ref 150–400)
RBC: 3.57 MIL/uL — AB (ref 3.87–5.11)
RDW: 14 % (ref 11.5–15.5)
WBC: 8.1 10*3/uL (ref 4.0–10.5)

## 2013-07-14 NOTE — Op Note (Signed)
NAME:  Jacqueline Wade, Jacqueline Wade NO.:  [id number]  MEDICAL RECORD NO.:  [id number]  LOCATION:  9304                          FACILITY:  [facility]  PHYSICIAN:  Freda Munro, M.D.    DATE OF BIRTH:  02-09-1971  DATE OF PROCEDURE:  07/13/2013 DATE OF DISCHARGE:                              OPERATIVE REPORT   PREOPERATIVE DIAGNOSES: 1. Menorrhagia. 2. Uterine fibroids. 3. Dysmenorrhea.  POSTOPERATIVE DIAGNOSES: 1. Menorrhagia. 2. Uterine fibroids. 3. Dysmenorrhea.  PROCEDURES: 1. Total abdominal hysterectomy. 2. Bilateral salpingectomy.  SURGEON:  Freda Munro, MD  ASSISTANT:  Barbaraann Rondo, MD  ANESTHESIA:  General.  ANTIBIOTICS:  Ancef 2 g.  DRAINS:  Foley bedside drainage.  ESTIMATED BLOOD LOSS:  250 mL.  SPECIMENS:  Cervix, uterus, fallopian tube sent to Pathology.  FINDINGS:  The patient had a multilobulated uterus consistent with fibroids.  The remaining abdomen and pelvis were within normal limits.  PROCEDURE IN DETAIL:  The patient was taken to the operating room, where general anesthetic was administered without difficulty.  She was then prepped and draped in the usual fashion for this procedure.  A Foley catheter was placed in her bladder.  A Pfannenstiel incision was made through the previous scar on entering the abdominal cavity, and exam of the abdominal and pelvic contents was undertaken.  An Fredia Sorrow retractor was placed and the bowel packed with two laps.  The uterus was grasped with Kelly forceps.  The ureters identified.  The round ligament on the left was clamped, cut, and ligated with 0 Monocryl suture.  Remaining broad ligament was opened.  The ovarian ligament and fallopian tube were then clamped, cut, and ligated with 0 Monocryl suture.  The bladder flap was taken down sharply.  The left uterine vessel was skeletonized, clamped, cut, and ligated with 0 Monocryl suture.  Similar procedure was performed on the opposite side.   Cardinal ligaments were then serially clamped, cut, and ligated with 0 Monocryl suture.  Once the level of the external os was reached, the vagina was cross clamped and the specimen removed.  Vaginal angles were closed using 0 Monocryl suture in a Heaney fashion.  The remaining vaginal cuff was closed with 0 Monocryl suture in a figure-of-eight.  The pelvis was then irrigated.  There was no evidence of any bleeding.  The fallopian tubes were then removed with the Bovie.  Ovaries were suspended to the round ligament's tub.  At this point, there was no evidence of any bleeding.  The [employer] retractor and laps were removed.  The parietal peritoneum and rectus muscles were reapproximated in the midline using 2-0 Monocryl in a running fashion.  The fascia was closed using 0 Monocryl suture in a running fashion.  Subcuticular tissue was made hemostatic with the Bovie.  The skin was closed using 4-0 Vicryl suture with a Lanny Hurst needle and Dermabond.  The patient was awoken and taken to the recovery room in stable condition.  Instrument and lap counts were correct x3.          ______________________________ Freda Munro, M.D.     MA/MEDQ  D:  07/13/2013  T:  07/14/2013  Job:  [id number]

## 2013-07-15 MED ORDER — IBUPROFEN 600 MG PO TABS: 600.0000 mg | ORAL_TABLET | Freq: Four times a day (QID) | ORAL | Status: AC | PRN

## 2013-07-15 MED ORDER — OXYCODONE-ACETAMINOPHEN 5-325 MG PO TABS: 1.0000 | ORAL_TABLET | ORAL | Status: AC | PRN

## 2013-07-15 NOTE — Progress Notes (Signed)
POD#2 Pt states that she is doing well. She has had flatus. Ambulating well. VSSAF IMP/ doing well PLAN/ Will discharge to home.

## 2013-07-15 NOTE — Discharge Summary (Signed)
NAME:  Jacqueline Wade, Jacqueline Wade NO.:  [id number]  MEDICAL RECORD NO.:  [id number]  LOCATION:  9304                          FACILITY:  [facility]  PHYSICIAN:  Freda Munro, M.D.    DATE OF BIRTH:  October 28, 1970  DATE OF ADMISSION:  07/13/2013 DATE OF DISCHARGE:  07/15/2013                              DISCHARGE SUMMARY   PRINCIPAL DISCHARGE DIAGNOSES: 1. Symptomatic uterine fibroids. 2. Pelvic pain. 3. Menorrhagia.  PRINCIPAL PROCEDURES:  Total abdominal hysterectomy with bilateral salpingectomy.  HISTORY OF PRESENT ILLNESS:  Ms. Jacqueline Wade is a 43 year old black female, G2, P1 who was admitted to [facility] on July 13, 2013 for a total abdominal hysterectomy with bilateral salpingectomy secondary to a several year history of worsening menorrhagia and dysmenorrhea.  The patient had been treated with a balloon ablation and oral contraceptive pills without improvement in her symptoms.  She declined placement of an IUD.  The patient has had a myomectomy and a cesarean section.  HOSPITAL COURSE:  The patient was admitted.  She underwent the above noted procedure.  A complete description of this can be found in dictated operative note.  The patient's postoperative course was benign. On postop day #1, the patient had a postop hemoglobin of 11.0.  At the time of discharge, she was ambulating without difficulty.  Her incision appeared to be healing well.  She was tolerating a diet.  She had adequate pain control.  She did have flatus.  Pathology was pending at this time.  She will be discharged to home with Percocet and Motrin. She will follow up in the office in 4 weeks.          ______________________________ Freda Munro, M.D.     MA/MEDQ  D:  07/15/2013  T:  07/15/2013  Job:  [id number]

## 2014-06-06 IMAGING — CT CT ABD-PELV W/ CM
1 of 2 series · 15 of 32 positions shown, 19 images · IV contrast (OMNIPAQUE 300)
Comparison: None available.

CLINICAL DATA: Right lower quadrant abdominal pain.  Nausea.
Myomectomy.

CT ABDOMEN AND PELVIS WITH CONTRAST
TECHNIQUE: Multidetector CT imaging of the abdomen and pelvis was
performed following the standard protocol during bolus
administration of intravenous contrast.
Contrast: 100mL OMNIPAQUE IOHEXOL 300 MG/ML  SOLN

[Series 2: abd/pel with · axial · 0.74mm/px · z∈[-423,-38]mm · 15 of 85 slices shown, 19 images]
[im 4/85  soft-tissue]
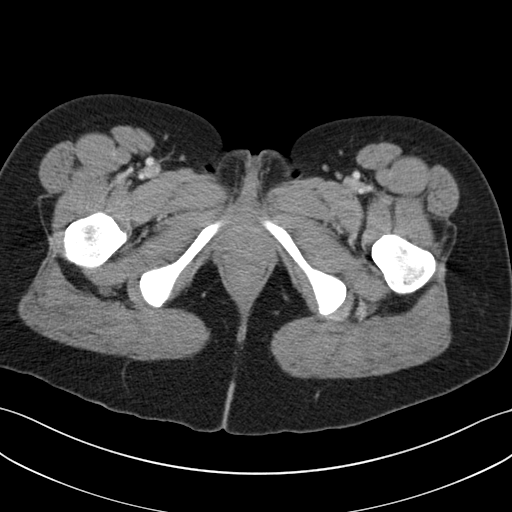
[im 4/85  bone]
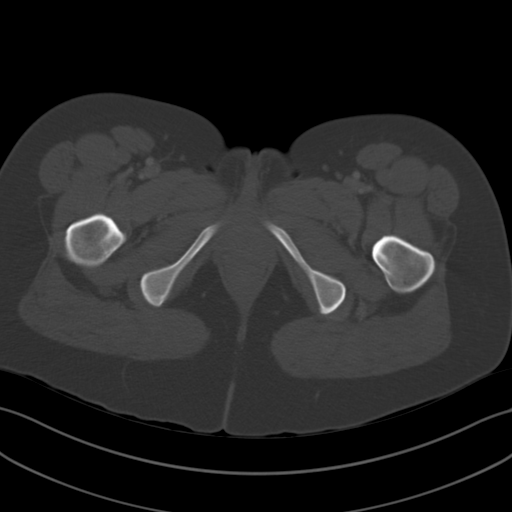
[im 11/85  soft-tissue]
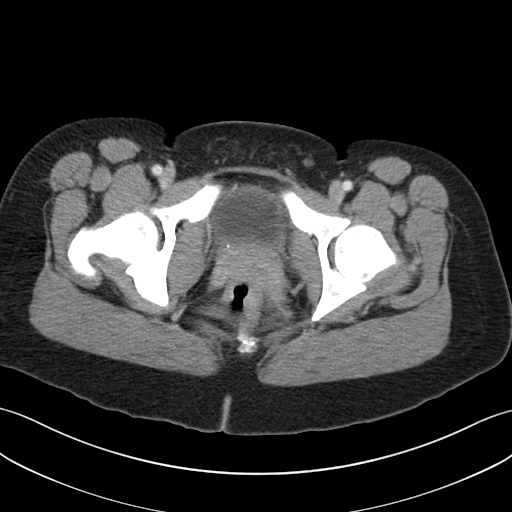
[im 17/85  soft-tissue]
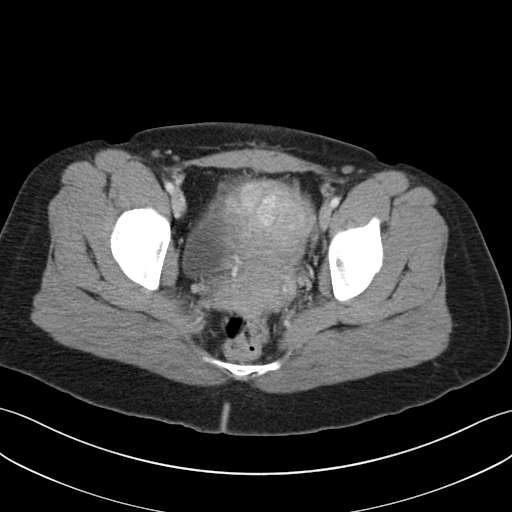
[im 24/85  soft-tissue]
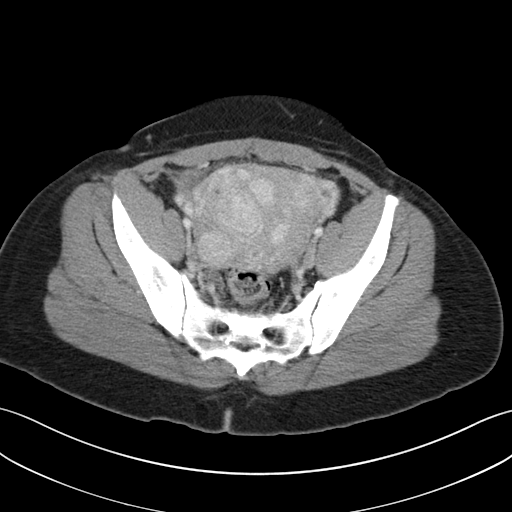
[im 31/85  soft-tissue]
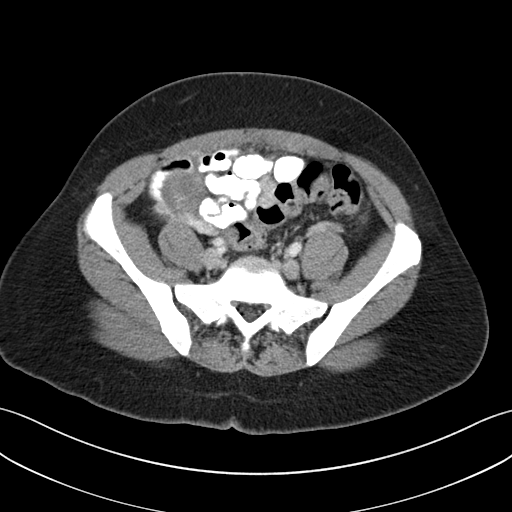
[im 37/85  soft-tissue]
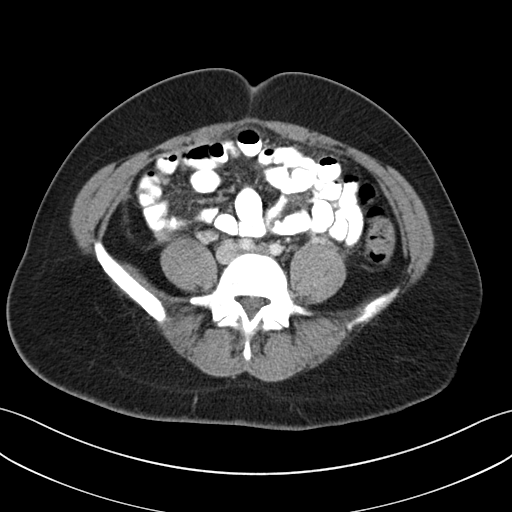
[im 44/85  soft-tissue]
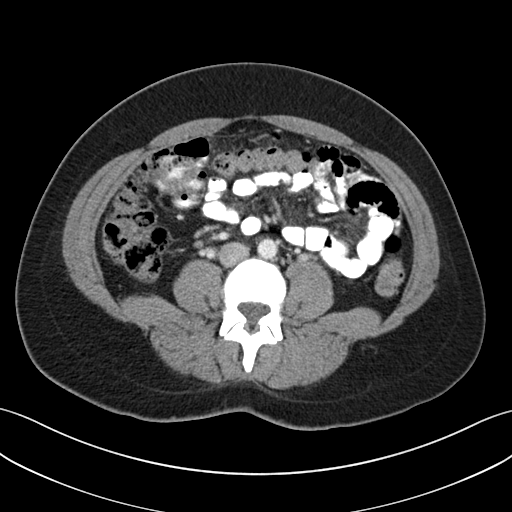
[im 48/85  soft-tissue]
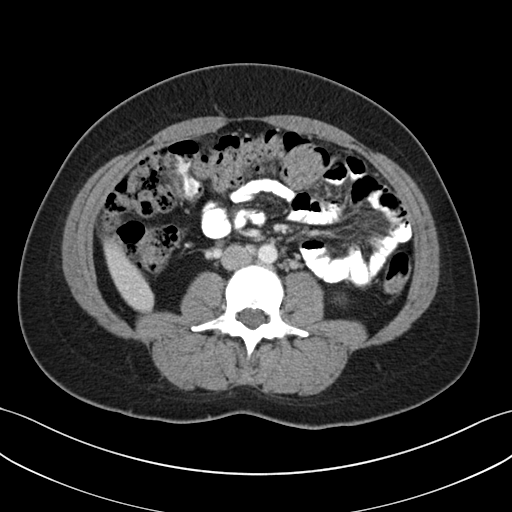
[im 54/85  soft-tissue]
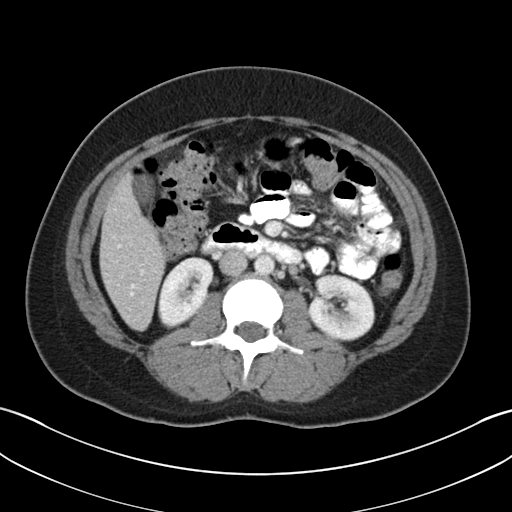
[im 54/85  bone]
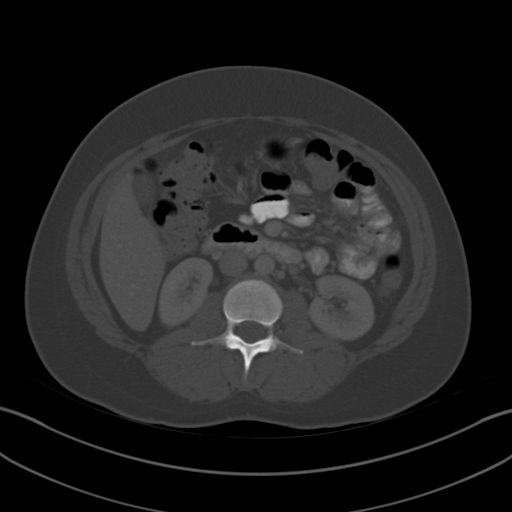
[im 61/85  soft-tissue]
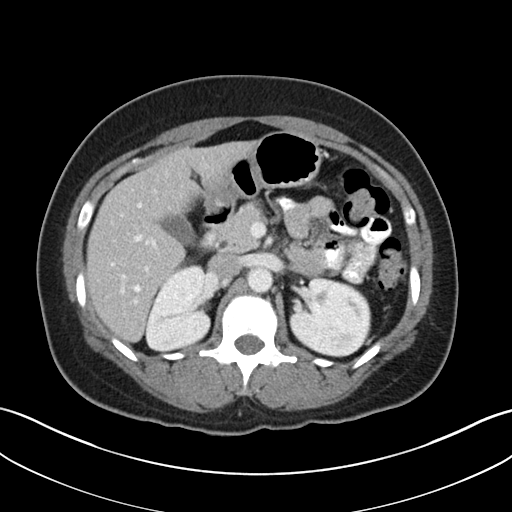
[im 68/85  soft-tissue]
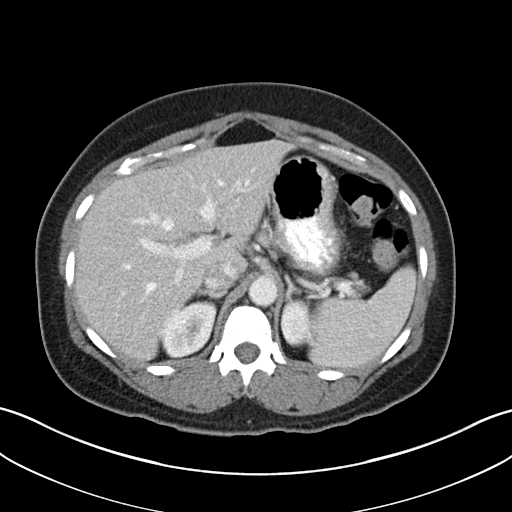
[im 71/85  lung]
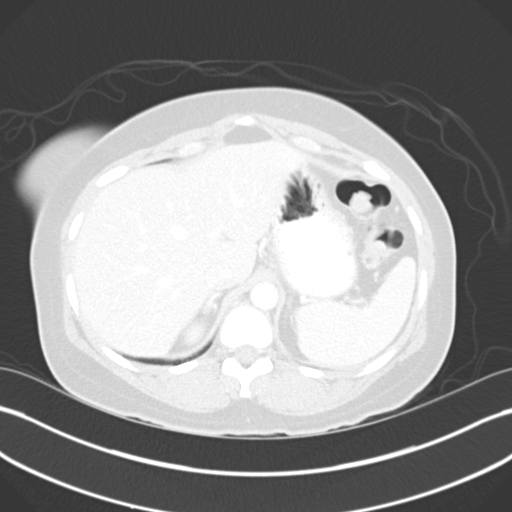
[im 74/85  soft-tissue]
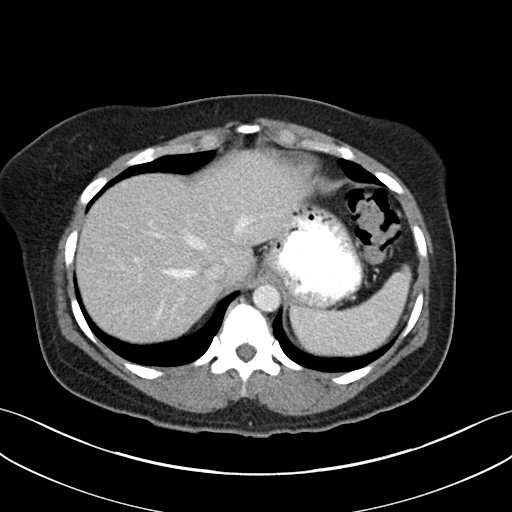
[im 74/85  lung]
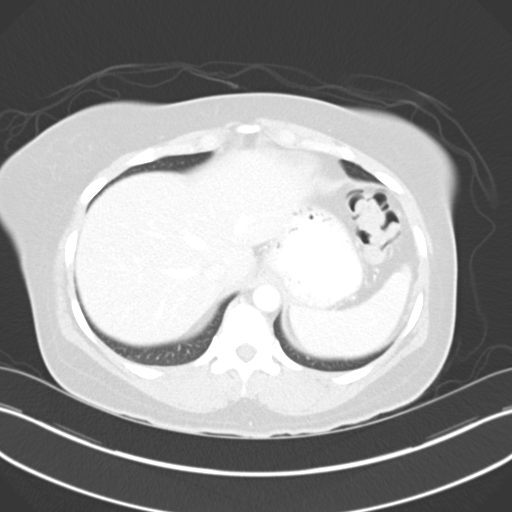
[im 78/85  lung]
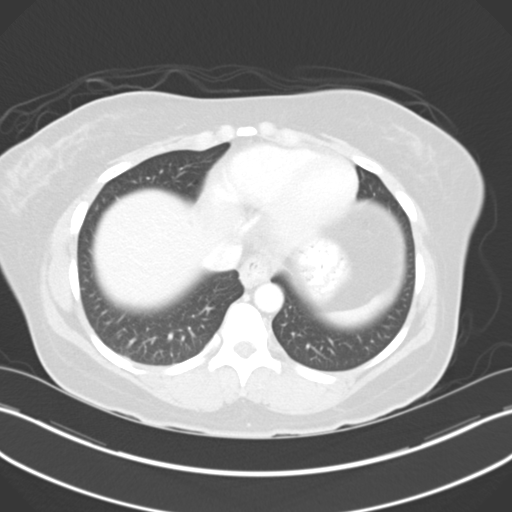
[im 81/85  soft-tissue]
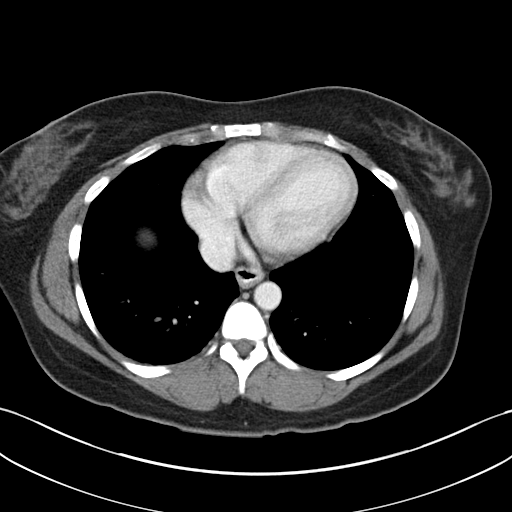
[im 81/85  lung]
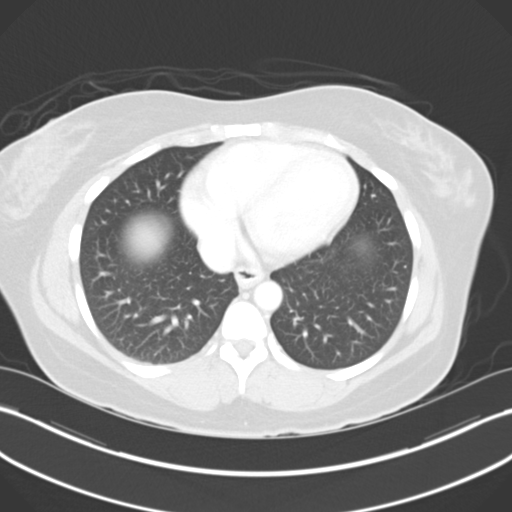

[15 of 32 positions shown; findings below may reference images not displayed]

FINDINGS: The lung bases are clear without focal nodule, mass, or
airspace disease.  The heart size is normal.  No significant
pleural or pericardial effusion is evident.

The liver and spleen are within normal limits.  A small hiatal
hernia is noted.  The stomach, duodenum, and pancreas are within
normal limits.  Common bile duct and gallbladder are normal.  The
adrenal glands are normal bilaterally.  The kidneys and ureters are
unremarkable.

Rectosigmoid colon is within normal limits.  The remainder of the
colon is within normal limits.  The appendix is visualized and
within normal limits.

The uterus is enlarged with multiple fibroids.  It measures 8.1 x
8.2 x 13.4 cm.  No dominant masses evident.  A low-density lesion
of the right ovary measures 3.2 x 3.7 cm, compatible with a simple
cyst.  No other significant adnexal lesions are evident.  There is
no significant free fluid or adenopathy.

The bone windows are unremarkable.
IMPRESSION: 1.  Normal appearance of the appendix.
2.  Peripherally enhancing 3.2 x 3.7 cm cyst in the right cystic
lesion in the right ovary could be etiology of the patient's pain.
3.  Enlarged fibroid uterus.
4.  No other acute or focal abnormality to explain the patient's
symptoms.

## 2014-09-01 ENCOUNTER — Other Ambulatory Visit (HOSPITAL_COMMUNITY): Payer: Self-pay | Admitting: Obstetrics and Gynecology

## 2014-09-01 DIAGNOSIS — Z1231 Encounter for screening mammogram for malignant neoplasm of breast: Secondary | ICD-10-CM

## 2014-09-08 ENCOUNTER — Ambulatory Visit (HOSPITAL_COMMUNITY): Payer: BC Managed Care – PPO

## 2014-09-13 ENCOUNTER — Ambulatory Visit (HOSPITAL_COMMUNITY)
Admission: RE | Admit: 2014-09-13 | Discharge: 2014-09-13 | Disposition: A | Discharge status: Home / Self Care | Payer: BC Managed Care – PPO | Source: Ambulatory Visit | Attending: Obstetrics and Gynecology | Admitting: Obstetrics and Gynecology

## 2014-09-13 DIAGNOSIS — Z1231 Encounter for screening mammogram for malignant neoplasm of breast: Secondary | ICD-10-CM | POA: Insufficient documentation

## 2014-09-21 ENCOUNTER — Other Ambulatory Visit: Payer: Self-pay | Admitting: Obstetrics and Gynecology

## 2014-09-21 DIAGNOSIS — R928 Other abnormal and inconclusive findings on diagnostic imaging of breast: Secondary | ICD-10-CM

## 2014-09-25 ENCOUNTER — Ambulatory Visit
Admission: RE | Admit: 2014-09-25 | Discharge: 2014-09-25 | Disposition: A | Discharge status: Home / Self Care | Payer: BC Managed Care – PPO | Source: Ambulatory Visit | Attending: Obstetrics and Gynecology | Admitting: Obstetrics and Gynecology

## 2014-09-25 DIAGNOSIS — R928 Other abnormal and inconclusive findings on diagnostic imaging of breast: Secondary | ICD-10-CM

## 2016-02-18 ENCOUNTER — Other Ambulatory Visit: Payer: Self-pay | Admitting: Obstetrics and Gynecology

## 2016-02-18 DIAGNOSIS — Z1231 Encounter for screening mammogram for malignant neoplasm of breast: Secondary | ICD-10-CM

## 2016-03-12 ENCOUNTER — Ambulatory Visit
Admission: RE | Admit: 2016-03-12 | Discharge: 2016-03-12 | Disposition: A | Discharge status: Home / Self Care | Payer: BC Managed Care – PPO | Source: Ambulatory Visit | Attending: Obstetrics and Gynecology | Admitting: Obstetrics and Gynecology

## 2016-03-12 DIAGNOSIS — Z1231 Encounter for screening mammogram for malignant neoplasm of breast: Secondary | ICD-10-CM

## 2017-03-30 ENCOUNTER — Ambulatory Visit
Admission: RE | Admit: 2017-03-30 | Discharge: 2017-03-30 | Disposition: A | Discharge status: Home / Self Care | Payer: BC Managed Care – PPO | Source: Ambulatory Visit | Attending: Obstetrics and Gynecology | Admitting: Obstetrics and Gynecology

## 2017-03-30 ENCOUNTER — Other Ambulatory Visit: Payer: Self-pay | Admitting: Obstetrics and Gynecology

## 2017-03-30 DIAGNOSIS — Z1231 Encounter for screening mammogram for malignant neoplasm of breast: Secondary | ICD-10-CM

## 2018-06-30 ENCOUNTER — Other Ambulatory Visit: Payer: Self-pay | Admitting: Obstetrics and Gynecology

## 2018-06-30 DIAGNOSIS — Z1231 Encounter for screening mammogram for malignant neoplasm of breast: Secondary | ICD-10-CM

## 2018-07-22 ENCOUNTER — Inpatient Hospital Stay: Admission: RE | Admit: 2018-07-22 | Payer: BC Managed Care – PPO | Source: Ambulatory Visit

## 2018-08-25 ENCOUNTER — Ambulatory Visit: Payer: BC Managed Care – PPO

## 2019-04-25 ENCOUNTER — Ambulatory Visit
Admission: RE | Admit: 2019-04-25 | Discharge: 2019-04-25 | Disposition: A | Discharge status: Home / Self Care | Payer: BC Managed Care – PPO | Source: Ambulatory Visit | Attending: Obstetrics and Gynecology | Admitting: Obstetrics and Gynecology

## 2019-04-25 ENCOUNTER — Other Ambulatory Visit: Payer: Self-pay

## 2019-04-25 DIAGNOSIS — Z1231 Encounter for screening mammogram for malignant neoplasm of breast: Secondary | ICD-10-CM

## 2019-05-19 ENCOUNTER — Other Ambulatory Visit: Payer: Self-pay

## 2019-05-19 ENCOUNTER — Emergency Department (HOSPITAL_COMMUNITY)
Admission: EM | Admit: 2019-05-19 | Discharge: 2019-05-19 | Disposition: A | Discharge status: Home / Self Care | Payer: BC Managed Care – PPO | Attending: Emergency Medicine | Admitting: Emergency Medicine

## 2019-05-19 ENCOUNTER — Encounter (HOSPITAL_COMMUNITY): Payer: Self-pay | Admitting: Emergency Medicine

## 2019-05-19 ENCOUNTER — Emergency Department (HOSPITAL_COMMUNITY): Payer: BC Managed Care – PPO

## 2019-05-19 DIAGNOSIS — U071 COVID-19: Secondary | ICD-10-CM | POA: Insufficient documentation

## 2019-05-19 DIAGNOSIS — R0789 Other chest pain: Secondary | ICD-10-CM

## 2019-05-19 DIAGNOSIS — J1289 Other viral pneumonia: Secondary | ICD-10-CM | POA: Diagnosis not present

## 2019-05-19 DIAGNOSIS — J1282 Pneumonia due to coronavirus disease 2019: Secondary | ICD-10-CM

## 2019-05-19 LAB — TROPONIN I (HIGH SENSITIVITY)
Troponin I (High Sensitivity): <2 ng/L (ref <18)
Troponin I (High Sensitivity): <2 ng/L (ref <18)

## 2019-05-19 LAB — BASIC METABOLIC PANEL
Anion gap: 12 (ref 5–15)
BUN: 7 mg/dL (ref 6–20)
CO2: 26 mmol/L (ref 22–32)
Calcium: 8.8 mg/dL — ABNORMAL LOW (ref 8.9–10.3)
Chloride: 100 mmol/L (ref 98–111)
Creatinine, Ser: 0.66 mg/dL (ref 0.44–1.00)
GFR calc Af Amer: >60 mL/min (ref >60)
GFR calc non Af Amer: >60 mL/min (ref >60)
Glucose, Bld: 89 mg/dL (ref 70–99)
Potassium: 3.1 mmol/L — ABNORMAL LOW (ref 3.5–5.1)
Sodium: 138 mmol/L (ref 135–145)

## 2019-05-19 LAB — CBC
HCT: 45.6 % (ref 36.0–46.0)
Hemoglobin: 15.3 g/dL — ABNORMAL HIGH (ref 12.0–15.0)
MCH: 31.3 pg (ref 26.0–34.0)
MCHC: 33.6 g/dL (ref 30.0–36.0)
MCV: 93.3 fL (ref 80.0–100.0)
Platelets: 197 10*3/uL (ref 150–400)
RBC: 4.89 MIL/uL (ref 3.87–5.11)
RDW: 12.1 % (ref 11.5–15.5)
WBC: 5.6 10*3/uL (ref 4.0–10.5)
nRBC: 0 % (ref 0.0–0.2)

## 2019-05-19 MED ORDER — ACETAMINOPHEN 500 MG PO TABS
1000.0000 mg | ORAL_TABLET | Freq: Once | ORAL | Status: AC
  Administered 2019-05-19: 1000 mg via ORAL
  Filled 2019-05-19: qty 2

## 2019-05-19 MED ORDER — ALBUTEROL SULFATE HFA 108 (90 BASE) MCG/ACT IN AERS
2.0000 | INHALATION_SPRAY | Freq: Once | RESPIRATORY_TRACT | Status: AC
  Administered 2019-05-19: 16:00:00 2 via RESPIRATORY_TRACT
  Filled 2019-05-19: qty 6.7

## 2019-05-19 MED ORDER — SODIUM CHLORIDE 0.9% FLUSH: 3.0000 mL | Freq: Once | INTRAVENOUS | Status: DC

## 2019-05-19 NOTE — ED Provider Notes (Signed)
[facility] DEPT Provider Note   CSN: [id number] Arrival date & time: 05/19/19  1224     History Chief Complaint  Patient presents with  . Chest Pain  . Covid positive    Jacqueline Wade is a 49 y.o. female.  Jacqueline Wade is a 49 y.o. female with a history of Graves' disease and uterine fibroids, diagnosed with Covid yesterday reporting 2 to 3 days of chest heaviness as well as nausea, vomiting, body aches, fatigue and chills.  She has had a cough occasionally productive of clear sputum.  She states she has not really had much shortness of breath aside from during coughing spells.  She describes chest pain as a heaviness and tightness in the center of her chest.  It is sometimes made worse with movement.  It is not worse with deep breath.  Pain is nonexertional, is not associate with diaphoresis, lightheadedness or syncope.  She has had some nausea and one episode of emesis.  Denies diarrhea or abdominal pain.  Denies any history of coronary artery disease or blood clots.  States she is used some over-the-counter medications to treat her symptoms without improvement.  Her PCP recommended that she come in for evaluation.        Past Medical History:  Diagnosis Date  . Graves disease    normal labs at last MD visit, no meds now    Patient Active Problem List   Diagnosis Date Noted  . Fibroids 07/13/2013    Past Surgical History:  Procedure Laterality Date  . ABDOMINAL HYSTERECTOMY N/A 07/13/2013   Procedure: HYSTERECTOMY ABDOMINAL;  Surgeon: Olga Millers, MD;  Location: [facility];  Service: Gynecology;  Laterality: N/A;  . CESAREAN SECTION    . MYOMECTOMY    . Uterine ablation       OB History   No obstetric history on file.     No family history on file.  Social History   Tobacco Use  . Smoking status: Never Smoker  . Smokeless tobacco: Never Used  Substance Use Topics  . Alcohol use: Yes    Comment: occ  . Drug use: No     Home Medications Prior to Admission medications   Medication Sig Start Date End Date Taking? Authorizing Provider  ibuprofen (ADVIL,MOTRIN) 600 MG tablet Take 1 tablet (600 mg total) by mouth every 6 (six) hours as needed. 07/15/13  Yes Olga Millers, MD  KLONOPIN 0.5 MG tablet Take 0.25 mg by mouth daily as needed for anxiety.  04/20/19  Yes [provider]  metoCLOPramide (REGLAN) 10 MG tablet Take 10 mg by mouth 4 (four) times daily as needed (for migraines).   Yes [provider]  Multiple Vitamin (MULTIVITAMIN WITH MINERALS) TABS Take 1 tablet by mouth daily.   Yes [provider]  TRINTELLIX 20 MG TABS tablet Take 20 mg by mouth daily. 04/21/19  Yes [provider]  oxyCODONE-acetaminophen (PERCOCET/ROXICET) 5-325 MG per tablet Take 1-2 tablets by mouth every 4 (four) hours as needed for severe pain (moderate to severe pain (when tolerating fluids)). Patient not taking: Reported on 05/19/2019 07/15/13   Olga Millers, MD    Allergies    Patient has no known allergies.  Review of Systems   Review of Systems  Constitutional: Positive for chills and fatigue. Negative for fever.  HENT: Positive for congestion and rhinorrhea.   Respiratory: Positive for cough. Negative for shortness of breath.   Cardiovascular: Positive for chest pain.  Negative for palpitations and leg swelling.  Gastrointestinal: Positive for nausea and vomiting. Negative for abdominal pain and diarrhea.  Genitourinary: Negative for dysuria and frequency.  Musculoskeletal: Positive for myalgias. Negative for arthralgias.  Skin: Negative for color change and rash.  Neurological: Negative for dizziness, syncope and light-headedness.    Physical Exam Updated Vital Signs BP 129/84   Pulse 98   Temp 99.2 F (37.3 C) (Oral)   Resp 17   LMP 06/10/2013   SpO2 98%   Physical Exam Vitals and nursing note reviewed.  Constitutional:      General: She is not in acute  distress.    Appearance: She is well-developed. She is not ill-appearing or diaphoretic.     Comments: Well-appearing and in no distress  HENT:     Head: Normocephalic and atraumatic.     Mouth/Throat:     Mouth: Mucous membranes are moist.     Pharynx: Oropharynx is clear. No oropharyngeal exudate or posterior oropharyngeal erythema.  Eyes:     General:        Right eye: No discharge.        Left eye: No discharge.     Pupils: Pupils are equal, round, and reactive to light.  Cardiovascular:     Rate and Rhythm: Normal rate and regular rhythm.     Pulses: Normal pulses.     Heart sounds: Normal heart sounds. No murmur. No friction rub. No gallop.   Pulmonary:     Effort: Pulmonary effort is normal. No respiratory distress.     Breath sounds: Normal breath sounds. No wheezing or rales.     Comments: Respirations equal and unlabored, patient able to speak in full sentences, lungs clear to auscultation bilaterally. There is some tenderness reproducible over the center of the chest, no overlying skin changes Chest:     Chest wall: Tenderness present.  Abdominal:     General: Bowel sounds are normal. There is no distension.     Palpations: Abdomen is soft. There is no mass.     Tenderness: There is no abdominal tenderness. There is no guarding.     Comments: Abdomen soft, nondistended, nontender to palpation in all quadrants without guarding or peritoneal signs  Musculoskeletal:        General: No deformity.     Cervical back: Neck supple.  Skin:    General: Skin is warm and dry.     Capillary Refill: Capillary refill takes less than 2 seconds.  Neurological:     Mental Status: She is alert.     Coordination: Coordination normal.     Comments: Speech is clear, able to follow commands Moves extremities without ataxia, coordination intact  Psychiatric:        Mood and Affect: Mood normal.        Behavior: Behavior normal.     ED Results / Procedures / Treatments   Labs (all  labs ordered are listed, but only abnormal results are displayed) Labs Reviewed  BASIC METABOLIC PANEL - Abnormal; Notable for the following components:      Result Value   Potassium 3.1 (*)    Calcium 8.8 (*)    All other components within normal limits  CBC - Abnormal; Notable for the following components:   Hemoglobin 15.3 (*)    All other components within normal limits  TROPONIN I (HIGH SENSITIVITY)  TROPONIN I (HIGH SENSITIVITY)    EKG EKG Interpretation  Date/Time:  Thursday May 19 2019 12:47:34 EST Ventricular  Rate:  93 PR Interval:  160 QRS Duration: 76 QT Interval:  360 QTC Calculation: 447 R Axis:   77 Text Interpretation: Normal sinus rhythm Possible Left atrial enlargement Cannot rule out Anterior infarct , age undetermined Abnormal ECG No previous ECGs available Confirmed by Theotis Burrow [phone]) on 05/20/2019 10:16:35 PM   Radiology DG Chest Port 1 View  Result Date: 05/19/2019 CLINICAL DATA:  COVID-19 positive test yesterday. Chest heaviness. Nausea and vomiting. Short of breath. EXAM: PORTABLE CHEST 1 VIEW COMPARISON:  None. FINDINGS: Cardiac silhouette normal in size. No mediastinal or hilar masses. No evidence of adenopathy. There is hazy left perihilar airspace opacity, also noted in the left lung base. Remainder of the lungs is clear. No pleural effusion or pneumothorax. Skeletal structures are grossly intact. IMPRESSION: 1. Subtle hazy airspace opacity projecting along the left perihilar region and left lower lung, consistent with pneumonia, suspicious for COVID-19 pneumonia. Electronically Signed   By: Lajean Manes M.D.   On: 05/19/2019 16:18    Procedures Procedures (including critical care time)  Medications Ordered in ED Medications  sodium chloride flush (NS) 0.9 % injection 3 mL ( Intravenous Canceled Entry 05/19/19 1459)  albuterol (VENTOLIN HFA) 108 (90 Base) MCG/ACT inhaler 2 puff (2 puffs Inhalation Given 05/19/19 1539)  acetaminophen  (TYLENOL) tablet 1,000 mg (1,000 mg Oral Given 05/19/19 1539)    ED Course  I have reviewed the triage vital signs and the nursing notes.  Pertinent labs & imaging results that were available during my care of the patient were reviewed by me and considered in my medical decision making (see chart for details).    MDM Rules/Calculators/A&P                      Patient presents to the emergency department with chest pain. Patient nontoxic appearing, in no apparent distress, vitals without significant abnormality. Fairly benign physical exam. No significant history of CAD or PE. Chest pain in the setting of COVID-19 infection, described as tightness may be related to bronchospasm, versus musculoskeletal chest wall pain from coughing.  DDX: ACS, pulmonary embolism, dissection, pneumothorax, pneumonia, arrhythmia, severe anemia, MSK, GERD, anxiety. Evaluation initiated with labs, EKG, and CXR. Patient on cardiac monitor.    CBC: No leukocytosis, hemoglobin of 13 5. BMP: Mild hypokalemia of 3.1, recommend increasing potassium in diet, no other significant electrolyte derangements. Troponin: Neg x2 EKG: Sinus rhythm without specific ST changes to suggest ischemia CXR: Chest x-ray with some subtle infiltrates in the left lung suspicious for Covid pneumonia  EKG without obvious acute ischemia, delta troponin negative, doubt ACS. Patient is low risk wells, PERC negative, doubt pulmonary embolism. Pain is not a tearing sensation, symmetric pulses, no widening of mediastinum on CXR, doubt dissection. Cardiac monitor reviewed, no notable arrhythmias or tachycardia.  Pain improved after albuterol in the setting of Covid suspect mild bronchospasm versus musculoskeletal element from cough.  Patient has appeared hemodynamically stable throughout ER visit and appears safe for discharge with close PCP/cardiology follow up. I discussed results, treatment plan, need for PCP follow-up, and return precautions with the  patient. Provided opportunity for questions, patient confirmed understanding and is in agreement with plan.   Final Clinical Impression(s) / ED Diagnoses Final diagnoses:  Atypical chest pain  Pneumonia due to COVID-19 virus    Rx / DC Orders ED Discharge Orders    None       Janet Berlin 05/21/19 2302    Lennice Sites, DO  05/22/19 0726  

## 2019-05-19 NOTE — ED Triage Notes (Addendum)
Tested Covid+ yesterday. Having chest heaviness, n/v, chills since Tuesday.

## 2019-05-19 NOTE — ED Notes (Signed)
Pt updated on delays- machine in lab is down.

## 2019-05-19 NOTE — ED Notes (Signed)
Pulse ox remained at 98-99% while ambulating

## 2019-05-19 NOTE — Discharge Instructions (Signed)
You have tested positive for COVID-19 virus.  Please continue to quarantine at home and monitor your symptoms closely. You chest x-ray shows some mild signs of Covid pneumonia and this may be contributing to the chest discomfort you are having, use albuterol inhaler as needed.Marland Kitchen Antibiotics are not helpful in treating viral infection, the virus should run its course in about 10-14 days. Please make sure you are drinking plenty of fluids. You can treat your symptoms supportively with tylenol for fevers and pains, and over the counter cough syrups and throat lozenges to help with cough. If your symptoms are not improving please follow up with you Primary doctor.   I recommend that you purchase a home pulse ox to help better monitor your oxygen at home, if you start to have increased work of breathing or shortness of breath or your oxygen drops below 90% please immediately return to the hospital for reevaluation.  If you develop persistent fevers, shortness of breath or difficulty breathing, chest pain, severe headache and neck pain, persistent nausea and vomiting or other new or concerning symptoms return to the Emergency department.

## 2019-05-19 NOTE — ED Notes (Signed)
IV removed from left AC. 

## 2020-06-14 ENCOUNTER — Other Ambulatory Visit: Payer: Self-pay | Admitting: Family Medicine

## 2020-06-14 DIAGNOSIS — Z1231 Encounter for screening mammogram for malignant neoplasm of breast: Secondary | ICD-10-CM

## 2020-07-11 ENCOUNTER — Other Ambulatory Visit: Payer: Self-pay

## 2020-07-11 ENCOUNTER — Ambulatory Visit
Admission: RE | Admit: 2020-07-11 | Discharge: 2020-07-11 | Disposition: A | Discharge status: Home / Self Care | Payer: BC Managed Care – PPO | Source: Ambulatory Visit

## 2020-07-11 DIAGNOSIS — Z1231 Encounter for screening mammogram for malignant neoplasm of breast: Secondary | ICD-10-CM

## 2024-01-29 ENCOUNTER — Other Ambulatory Visit: Payer: Self-pay | Admitting: Family Medicine

## 2024-01-29 DIAGNOSIS — Z1231 Encounter for screening mammogram for malignant neoplasm of breast: Secondary | ICD-10-CM

## 2024-02-10 ENCOUNTER — Ambulatory Visit
Admission: RE | Admit: 2024-02-10 | Discharge: 2024-02-10 | Disposition: A | Discharge status: Home / Self Care | Source: Ambulatory Visit

## 2024-02-10 DIAGNOSIS — Z1231 Encounter for screening mammogram for malignant neoplasm of breast: Secondary | ICD-10-CM
